# Patient Record
Sex: Female | Born: 1940 | Race: Black or African American | Hispanic: No | Marital: Single | Smoking: Current every day smoker
Health system: Southern US, Community
[De-identification: ages and names within clinical notes are randomized; demographics above are authoritative.]

## PROBLEM LIST (undated history)

## (undated) DIAGNOSIS — J449 Chronic obstructive pulmonary disease, unspecified: Secondary | ICD-10-CM

## (undated) DIAGNOSIS — M109 Gout, unspecified: Secondary | ICD-10-CM

## (undated) DIAGNOSIS — R609 Edema, unspecified: Secondary | ICD-10-CM

## (undated) DIAGNOSIS — I10 Essential (primary) hypertension: Secondary | ICD-10-CM

## (undated) DIAGNOSIS — M810 Age-related osteoporosis without current pathological fracture: Secondary | ICD-10-CM

## (undated) DIAGNOSIS — I639 Cerebral infarction, unspecified: Secondary | ICD-10-CM

## (undated) DIAGNOSIS — I509 Heart failure, unspecified: Secondary | ICD-10-CM

## (undated) DIAGNOSIS — E785 Hyperlipidemia, unspecified: Secondary | ICD-10-CM

## (undated) DIAGNOSIS — E039 Hypothyroidism, unspecified: Secondary | ICD-10-CM

## (undated) HISTORY — PX: ABDOMINAL HYSTERECTOMY: SHX81

---

## 2003-12-10 ENCOUNTER — Ambulatory Visit (HOSPITAL_COMMUNITY): Admission: RE | Admit: 2003-12-10 | Discharge: 2003-12-10 | Payer: Self-pay | Admitting: Internal Medicine

## 2012-05-30 ENCOUNTER — Non-Acute Institutional Stay (SKILLED_NURSING_FACILITY): Payer: Medicare Other | Admitting: Internal Medicine

## 2012-05-30 DIAGNOSIS — I10 Essential (primary) hypertension: Secondary | ICD-10-CM

## 2012-05-30 DIAGNOSIS — E7800 Pure hypercholesterolemia, unspecified: Secondary | ICD-10-CM

## 2012-05-30 DIAGNOSIS — E039 Hypothyroidism, unspecified: Secondary | ICD-10-CM

## 2012-05-30 DIAGNOSIS — I509 Heart failure, unspecified: Secondary | ICD-10-CM

## 2012-06-03 NOTE — Progress Notes (Signed)
PROGRESS NOTE  DATE: 05/30/12  FACILITY: [facility]  LEVEL OF CARE: SNF  Routine Visit  CHIEF COMPLAINT:  Manage hypothyroidism and CHF  HISTORY OF PRESENT ILLNESS:  REASSESSMENT OF ONGOING PROBLEM(S):  1. CHF:The patient does not relate significant weight changes, denies sob, DOE, orthopnea, PNDs, pedal edema, palpitations or chest pain.  CHF remains stable.  No complications form the medications being used.  2. HYPOTHYROIDISM: The hypothyroidism remains stable. No complications noted from the medications presently being used.  The patient denies fatigue or constipation.  Last TSH 2.635 in 9/13.  PAST MEDICAL HISTORY : Reviewed.  No changes.  CURRENT MEDICATIONS: Reviewed per [facility]  REVIEW OF SYSTEMS:  GENERAL: no change in appetite, no fatigue, no weight changes, no fever, chills or weakness RESPIRATORY: no cough, SOB, DOE, wheezing, hemoptysis CARDIAC: no chest pain, edema or palpitations GI: no abdominal pain, diarrhea, constipation, heart burn, nausea or vomiting  PHYSICAL EXAMINATION  VS:  T 97       P 70      RR 20      BP 140/60     POX % 97     WT (Lb)  GENERAL: no acute distress, normal body habitus EYES: conjunctivae normal, sclerae normal, normal eye lids RESPIRATORY: breathing is even & unlabored, BS CTAB CARDIAC: RRR, no murmur,no extra heart sounds, no edema GI: abdomen soft, normal BS, no masses, no tenderness, no hepatomegaly, no splenomegaly PSYCHIATRIC: the patient is alert & oriented to person, affect & behavior appropriate  LABS/RADIOLOGY:  9/13 CBC and CMP normal 8/13 fasting lipid panel normal  ASSESSMENT/PLAN:  1. CHF-well compensated. 2. hypothyroidism-well controlled. Recheck TSH. 3. hyperlipidemia-recheck fasting lipid panel. 4. hypertension-blood pressure borderline. Will monitor for now. We'll review a BP log. 5. COPD-compensated. 6. check CBC and CMP.  CPT CODE: 16109

## 2012-07-19 ENCOUNTER — Non-Acute Institutional Stay (SKILLED_NURSING_FACILITY): Payer: Medicare Other | Admitting: Internal Medicine

## 2012-07-19 DIAGNOSIS — E7800 Pure hypercholesterolemia, unspecified: Secondary | ICD-10-CM

## 2012-07-19 DIAGNOSIS — I10 Essential (primary) hypertension: Secondary | ICD-10-CM

## 2012-07-19 DIAGNOSIS — E039 Hypothyroidism, unspecified: Secondary | ICD-10-CM

## 2012-07-19 DIAGNOSIS — I509 Heart failure, unspecified: Secondary | ICD-10-CM

## 2012-07-22 DIAGNOSIS — I509 Heart failure, unspecified: Secondary | ICD-10-CM | POA: Insufficient documentation

## 2012-07-22 DIAGNOSIS — E7800 Pure hypercholesterolemia, unspecified: Secondary | ICD-10-CM | POA: Insufficient documentation

## 2012-07-22 DIAGNOSIS — I10 Essential (primary) hypertension: Secondary | ICD-10-CM | POA: Insufficient documentation

## 2012-07-22 DIAGNOSIS — E039 Hypothyroidism, unspecified: Secondary | ICD-10-CM | POA: Insufficient documentation

## 2012-07-22 NOTE — Progress Notes (Signed)
PROGRESS NOTE  DATE: 07/19/12  FACILITY: [facility]  LEVEL OF CARE: SNF  Routine Visit  CHIEF COMPLAINT:  Manage hypothyroidism, hypertension and CHF  HISTORY OF PRESENT ILLNESS:  REASSESSMENT OF ONGOING PROBLEM(S):  1. CHF:The patient does not relate significant weight changes, denies sob, DOE, orthopnea, PNDs, pedal edema, palpitations or chest pain.  CHF remains stable.  No complications form the medications being used.  2. HYPOTHYROIDISM: The hypothyroidism remains stable. No complications noted from the medications presently being used.  The patient denies fatigue or constipation.  Last TSH 2.453 in 4/14,  2.635 in 9/13.  3. HTN: Pt 's HTN remains stable.  Denies CP, sob, DOE, headaches, dizziness or visual disturbances.  No complications from the medications currently being used.  Last BP : 146/71, 158/82, 165/84.  PAST MEDICAL HISTORY : Reviewed.  No changes.  CURRENT MEDICATIONS: Reviewed per [facility]  REVIEW OF SYSTEMS:  GENERAL: no change in appetite, no fatigue, no weight changes, no fever, chills or weakness RESPIRATORY: no cough, SOB, DOE, wheezing, hemoptysis CARDIAC: no chest pain, edema or palpitations GI: no abdominal pain, diarrhea, constipation, heart burn, nausea or vomiting  PHYSICAL EXAMINATION  VS:  T 98.6       P96      RR 22     BP 146/71     POX %     WT (Lb)  GENERAL: no acute distress, normal body habitus EYES: conjunctivae normal, sclerae normal, normal eye lids NECK: No thyromegaly, no JVD, no masses, trachea midline RESPIRATORY: breathing is even & unlabored, BS CTAB CARDIAC: RRR, no murmur,no extra heart sounds, no edema GI: abdomen soft, normal BS, no masses, no tenderness, no hepatomegaly, no splenomegaly PSYCHIATRIC: the patient is alert & oriented to person, affect & behavior appropriate  LABS/RADIOLOGY:  4/14 platelets 473 otherwise CBC normal, CMP normal, fasting lipid panel normal  9/13 CBC and CMP normal 8/13 fasting lipid  panel normal  ASSESSMENT/PLAN:  1. CHF-well compensated. 2. hypothyroidism-well controlled. 3. hyperlipidemia-well controlled. 4. hypertension-uncontrolled. Increase lisinopril to 40 mg daily. 5. COPD-compensated. 6. check BMP on 5/24.  CPT CODE: 16109

## 2012-09-27 ENCOUNTER — Non-Acute Institutional Stay (SKILLED_NURSING_FACILITY): Payer: Medicare Other | Admitting: Adult Health

## 2012-09-27 ENCOUNTER — Encounter: Payer: Self-pay | Admitting: Adult Health

## 2012-09-27 DIAGNOSIS — E7800 Pure hypercholesterolemia, unspecified: Secondary | ICD-10-CM

## 2012-09-27 DIAGNOSIS — I509 Heart failure, unspecified: Secondary | ICD-10-CM

## 2012-09-27 DIAGNOSIS — G8194 Hemiplegia, unspecified affecting left nondominant side: Secondary | ICD-10-CM

## 2012-09-27 DIAGNOSIS — J449 Chronic obstructive pulmonary disease, unspecified: Secondary | ICD-10-CM | POA: Insufficient documentation

## 2012-09-27 DIAGNOSIS — J439 Emphysema, unspecified: Secondary | ICD-10-CM | POA: Insufficient documentation

## 2012-09-27 DIAGNOSIS — G8190 Hemiplegia, unspecified affecting unspecified side: Secondary | ICD-10-CM

## 2012-09-27 DIAGNOSIS — I10 Essential (primary) hypertension: Secondary | ICD-10-CM

## 2012-09-27 DIAGNOSIS — E039 Hypothyroidism, unspecified: Secondary | ICD-10-CM

## 2012-09-27 NOTE — Progress Notes (Signed)
Patient ID: Suzanne Rich, female   DOB: May 11, 1940, 72 y.o.   MRN: [id number]         PROGRESS NOTE  DATE: 09/27/12  FACILITY: [facility]  LEVEL OF CARE: SNF  Routine Visit  CHIEF COMPLAINT:  Manage hypothyroidism, hypertension and CHF  HISTORY OF PRESENT ILLNESS:  REASSESSMENT OF ONGOING PROBLEM(S):  1. CHF:The patient does not relate significant weight changes, denies sob, DOE, orthopnea, PNDs, pedal edema, palpitations or chest pain.  CHF remains stable.  No complications form the medications being used.  2. HTN: Denies CP, sob, DOE, headaches, dizziness or visual disturbances.  No complications from the medications currently being used.  Last BP :163/76  3. COPD: the COPD remains stable.  Pt denies sob, cough, wheezing or declining exercise tolerance.  No complications from the medications presently being used.  PAST MEDICAL HISTORY : Reviewed.  No changes.  CURRENT MEDICATIONS: Reviewed per [facility] [place]  REVIEW OF SYSTEMS:  GENERAL: no change in appetite, no fatigue, no weight changes, no fever, chills or weakness RESPIRATORY: no cough, SOB, DOE, wheezing, hemoptysis CARDIAC: no chest pain, edema or palpitations GI: no abdominal pain, diarrhea, constipation, heart burn, nausea or vomiting  PHYSICAL EXAMINATION  VS:  T 98.9       P 63       RR18    BP 163/76        WT186.8 (Lb)  GENERAL: no acute distress, normal body habitus NECK: No thyromegaly, no JVD, no masses, trachea midline RESPIRATORY: breathing is even & unlabored, BS CTAB CARDIAC: RRR, no murmur,no extra heart sounds, no edema GI: abdomen soft, normal BS, no masses, no tenderness, no hepatomegaly, no splenomegaly EXTREMITIES:  Left hemiplegia PSYCHIATRIC: the patient is alert & oriented to person, affect & behavior appropriate  LABS/RADIOLOGY: 08/17/12 knee pain profile normal 07/25/12 sodium 134 potassium 4.3 glucose 117 BUN 9 creatinine 0.82 calcium 9.6 4/14 platelets 473 otherwise CBC normal, CMP normal,  fasting lipid panel normal 9/13 CBC and CMP normal 8/13 fasting lipid panel normal  ASSESSMENT/PLAN:  1. CHF-well compensated. 2. hypothyroidism-well controlled. 3. hyperlipidemia-well controlled. 4. hypertension-uncontrolled. Increase Lopressor to 75 mg by mouth twice a day; heart rate/BP every shift x1 week 5. COPD-compensated.  CPT CODE: 78295

## 2012-10-25 LAB — HM DIABETES FOOT EXAM

## 2012-10-26 ENCOUNTER — Non-Acute Institutional Stay (SKILLED_NURSING_FACILITY): Payer: Medicare Other | Admitting: Internal Medicine

## 2012-10-26 DIAGNOSIS — E7800 Pure hypercholesterolemia, unspecified: Secondary | ICD-10-CM

## 2012-10-26 DIAGNOSIS — I10 Essential (primary) hypertension: Secondary | ICD-10-CM

## 2012-10-26 DIAGNOSIS — E039 Hypothyroidism, unspecified: Secondary | ICD-10-CM

## 2012-10-26 DIAGNOSIS — I509 Heart failure, unspecified: Secondary | ICD-10-CM

## 2012-10-26 NOTE — Progress Notes (Signed)
PROGRESS NOTE  DATE: 10/26/12  FACILITY: [facility]  LEVEL OF CARE: SNF  Routine Visit  CHIEF COMPLAINT:  Manage hypothyroidism, hypertension and CHF  HISTORY OF PRESENT ILLNESS:  REASSESSMENT OF ONGOING PROBLEM(S):  CHF:The patient does not relate significant weight changes, denies sob, DOE, orthopnea, PNDs, pedal edema, palpitations or chest pain.  CHF remains stable.  No complications form the medications being used.  HYPOTHYROIDISM: The hypothyroidism remains stable. No complications noted from the medications presently being used.  The patient denies fatigue or constipation.  Last TSH 2.453 in 4/14,  2.635 in 9/13.  HTN: Pt 's HTN remains stable.  Denies CP, sob, DOE, headaches, dizziness or visual disturbances.  No complications from the medications currently being used.  Last BP : 146/71, 158/82, 165/84, 120/73.  PAST MEDICAL HISTORY : Reviewed.  No changes.  CURRENT MEDICATIONS: Reviewed per [facility]  REVIEW OF SYSTEMS:  GENERAL: no change in appetite, no fatigue, no weight changes, no fever, chills or weakness RESPIRATORY: no cough, SOB, DOE, wheezing, hemoptysis CARDIAC: no chest pain, edema or palpitations GI: no abdominal pain, diarrhea, constipation, heart burn, nausea or vomiting  PHYSICAL EXAMINATION  VS:  T 97.6       P 77      RR 16     BP 120/73     POX %     WT (Lb)  GENERAL: no acute distress, normal body habitus NECK: No thyromegaly, no JVD, no masses, trachea midline RESPIRATORY: breathing is even & unlabored, BS CTAB CARDIAC: RRR, no murmur,no extra heart sounds, no edema GI: abdomen soft, normal BS, no masses, no tenderness, no hepatomegaly, no splenomegaly PSYCHIATRIC: the patient is alert & oriented to person, affect & behavior appropriate  LABS/RADIOLOGY:  6-14 fasting lipid panel normal 5-14 glucose 117 otherwise BMP normal  4/14 platelets 473 otherwise CBC normal, CMP normal, fasting lipid panel normal  9/13 CBC and CMP normal 8/13  fasting lipid panel normal  ASSESSMENT/PLAN:  CHF-well compensated. hypothyroidism-well controlled. hyperlipidemia-well controlled. hypertension-Lopressor was increased COPD-compensated.  CPT CODE: 16109

## 2012-11-22 ENCOUNTER — Non-Acute Institutional Stay (SKILLED_NURSING_FACILITY): Payer: Medicare Other | Admitting: Internal Medicine

## 2012-11-22 DIAGNOSIS — E039 Hypothyroidism, unspecified: Secondary | ICD-10-CM

## 2012-11-22 DIAGNOSIS — E7800 Pure hypercholesterolemia, unspecified: Secondary | ICD-10-CM

## 2012-11-22 DIAGNOSIS — I509 Heart failure, unspecified: Secondary | ICD-10-CM

## 2012-11-22 DIAGNOSIS — I10 Essential (primary) hypertension: Secondary | ICD-10-CM

## 2012-11-22 NOTE — Progress Notes (Signed)
PROGRESS NOTE  DATE: 11/22/12  FACILITY: [facility]  LEVEL OF CARE: SNF  Routine Visit  CHIEF COMPLAINT:  Manage hypothyroidism, hypertension and CHF  HISTORY OF PRESENT ILLNESS:  REASSESSMENT OF ONGOING PROBLEM(S):  CHF:The patient does not relate significant weight changes, denies sob, DOE, orthopnea, PNDs, pedal edema, palpitations or chest pain.  CHF remains stable.  No complications form the medications being used.  HYPOTHYROIDISM: The hypothyroidism remains stable. No complications noted from the medications presently being used.  The patient denies fatigue or constipation.  Last TSH 2.453 in 4/14,  2.635 in 9/13.  HTN: Pt 's HTN remains stable.  Denies CP, sob, DOE, headaches, dizziness or visual disturbances.  No complications from the medications currently being used.  Last BP : 146/71, 158/82, 165/84, 120/73, 135/60.  PAST MEDICAL HISTORY : Reviewed.  No changes.  CURRENT MEDICATIONS: Reviewed per [facility]  REVIEW OF SYSTEMS:  GENERAL: no change in appetite, no fatigue, no weight changes, no fever, chills or weakness RESPIRATORY: no cough, SOB, DOE, wheezing, hemoptysis CARDIAC: no chest pain, edema or palpitations GI: no abdominal pain, diarrhea, constipation, heart burn, nausea or vomiting  PHYSICAL EXAMINATION  VS:  T 98.2       P 66      RR 20     BP 135/60     POX %     WT (Lb)  GENERAL: no acute distress, normal body habitus NECK: No thyromegaly, no JVD, no masses, trachea midline RESPIRATORY: breathing is even & unlabored, BS CTAB CARDIAC: RRR, no murmur,no extra heart sounds, left lower extremity +2 edema GI: abdomen soft, normal BS, no masses, no tenderness, no hepatomegaly, no splenomegaly PSYCHIATRIC: the patient is alert & oriented to person, affect & behavior appropriate  LABS/RADIOLOGY:  6-14 fasting lipid panel normal 5-14 glucose 117 otherwise BMP normal  4/14 platelets 473 otherwise CBC normal, CMP normal, fasting lipid panel  normal  9/13 CBC and CMP normal 8/13 fasting lipid panel normal  ASSESSMENT/PLAN:  CHF-well compensated. hypothyroidism-well controlled. hyperlipidemia-well controlled. hypertension-well controlled COPD-compensated.  CPT CODE: 56213

## 2012-12-22 ENCOUNTER — Non-Acute Institutional Stay (SKILLED_NURSING_FACILITY): Payer: PRIVATE HEALTH INSURANCE | Admitting: Internal Medicine

## 2012-12-22 DIAGNOSIS — E039 Hypothyroidism, unspecified: Secondary | ICD-10-CM

## 2012-12-22 DIAGNOSIS — E7800 Pure hypercholesterolemia, unspecified: Secondary | ICD-10-CM

## 2012-12-22 DIAGNOSIS — I509 Heart failure, unspecified: Secondary | ICD-10-CM

## 2012-12-22 DIAGNOSIS — I10 Essential (primary) hypertension: Secondary | ICD-10-CM

## 2012-12-22 NOTE — Progress Notes (Signed)
PROGRESS NOTE  DATE: 12/22/12  FACILITY: [facility]  LEVEL OF CARE: SNF  Routine Visit  CHIEF COMPLAINT:  Manage hypothyroidism, hypertension and CHF  HISTORY OF PRESENT ILLNESS:  REASSESSMENT OF ONGOING PROBLEM(S):  CHF:The patient does not relate significant weight changes, denies sob, DOE, orthopnea, PNDs, pedal edema, palpitations or chest pain.  CHF remains stable.  No complications form the medications being used.  HYPOTHYROIDISM: The hypothyroidism remains stable. No complications noted from the medications presently being used.  The patient denies fatigue or constipation.  Last TSH 2.453 in 4/14,  2.635 in 9/13.  HTN: Pt 's HTN remains stable.  Denies CP, sob, DOE, headaches, dizziness or visual disturbances.  No complications from the medications currently being used.  Last BP : 146/71, 158/82, 165/84, 120/73, 135/60, 133/80.  PAST MEDICAL HISTORY : Reviewed.  No changes.  CURRENT MEDICATIONS: Reviewed per [facility]  REVIEW OF SYSTEMS:  GENERAL: no change in appetite, no fatigue, no weight changes, no fever, chills or weakness RESPIRATORY: no cough, SOB, DOE, wheezing, hemoptysis CARDIAC: no chest pain, edema or palpitations GI: no abdominal pain, diarrhea, constipation, heart burn, nausea or vomiting  PHYSICAL EXAMINATION  VS:  T 97.4       P 90      RR 19     BP 133/80     POX % 99     WT (Lb)  GENERAL: no acute distress, normal body habitus EYES: nl sclerae, nl conjunctivae, no discharge NECK: No thyromegaly, no JVD, no masses, trachea midline LYMPHATICS: no cervical LAN, no supraclavicular LAN RESPIRATORY: breathing is even & unlabored, BS CTAB CARDIAC: RRR, no murmur,no extra heart sounds, left lower extremity +2 edema GI: abdomen soft, normal BS, no masses, no tenderness, no hepatomegaly, no splenomegaly PSYCHIATRIC: the patient is alert & oriented to person, affect & behavior appropriate  LABS/RADIOLOGY:  6-14 fasting lipid panel normal 5-14 glucose  117 otherwise BMP normal  4/14 platelets 473 otherwise CBC normal, CMP normal, fasting lipid panel normal  9/13 CBC and CMP normal 8/13 fasting lipid panel normal  ASSESSMENT/PLAN:  CHF-well compensated. hypothyroidism-well controlled.  Check TSH. hyperlipidemia-well controlled. hypertension-well controlled COPD-compensated. Check cbc & cmp  CPT CODE: 16109

## 2013-01-10 ENCOUNTER — Non-Acute Institutional Stay (SKILLED_NURSING_FACILITY): Payer: PRIVATE HEALTH INSURANCE | Admitting: Internal Medicine

## 2013-01-10 DIAGNOSIS — I509 Heart failure, unspecified: Secondary | ICD-10-CM

## 2013-01-10 DIAGNOSIS — E039 Hypothyroidism, unspecified: Secondary | ICD-10-CM

## 2013-01-10 DIAGNOSIS — E7800 Pure hypercholesterolemia, unspecified: Secondary | ICD-10-CM

## 2013-01-10 DIAGNOSIS — I10 Essential (primary) hypertension: Secondary | ICD-10-CM

## 2013-01-12 NOTE — Progress Notes (Signed)
PROGRESS NOTE  DATE: 01/10/13  FACILITY: [facility]  LEVEL OF CARE: SNF  Routine Visit  CHIEF COMPLAINT:  Manage hypothyroidism, hypertension and CHF  HISTORY OF PRESENT ILLNESS:  REASSESSMENT OF ONGOING PROBLEM(S):  CHF:The patient does not relate significant weight changes, denies sob, DOE, orthopnea, PNDs, pedal edema, palpitations or chest pain.  CHF remains stable.  No complications form the medications being used.  HYPOTHYROIDISM: The hypothyroidism remains stable. No complications noted from the medications presently being used.  The patient denies fatigue or constipation.  Last TSH 2.453 in 4/14,  2.635 in 9/13.  HTN: Pt 's HTN remains stable.  Denies CP, sob, DOE, headaches, dizziness or visual disturbances.  No complications from the medications currently being used.  Last BP : 146/71, 158/82, 165/84, 120/73, 135/60, 133/80, 138/80.  PAST MEDICAL HISTORY : Reviewed.  No changes.  CURRENT MEDICATIONS: Reviewed per [facility]  REVIEW OF SYSTEMS:  GENERAL: no change in appetite, no fatigue, no weight changes, no fever, chills or weakness RESPIRATORY: no cough, SOB, DOE, wheezing, hemoptysis CARDIAC: no chest pain, edema or palpitations GI: no abdominal pain, diarrhea, constipation, heart burn, nausea or vomiting  PHYSICAL EXAMINATION  VS:  T 98.3       P 75      RR 18     BP 138/80     POX %     WT (Lb)  GENERAL: no acute distress, normal body habitus EYES: nl sclerae, nl conjunctivae, no discharge NECK: No thyromegaly, no JVD, no masses, trachea midline LYMPHATICS: no cervical LAN, no supraclavicular LAN RESPIRATORY: breathing is even & unlabored, BS CTAB CARDIAC: RRR, no murmur,no extra heart sounds, left lower extremity +2 edema GI: abdomen soft, normal BS, no masses, no tenderness, no hepatomegaly, no splenomegaly PSYCHIATRIC: the patient is alert & oriented to person, affect & behavior appropriate  LABS/RADIOLOGY:  6-14 fasting lipid panel normal 5-14  glucose 117 otherwise BMP normal  4/14 platelets 473 otherwise CBC normal, CMP normal, fasting lipid panel normal  9/13 CBC and CMP normal 8/13 fasting lipid panel normal  ASSESSMENT/PLAN:  CHF-well compensated. hypothyroidism-well controlled.  Check TSH. hyperlipidemia-well controlled. hypertension-well controlled COPD-compensated. Check cbc & cmp  CPT CODE: 16109

## 2013-01-31 ENCOUNTER — Non-Acute Institutional Stay (SKILLED_NURSING_FACILITY): Payer: PRIVATE HEALTH INSURANCE | Admitting: Internal Medicine

## 2013-01-31 DIAGNOSIS — I509 Heart failure, unspecified: Secondary | ICD-10-CM

## 2013-01-31 DIAGNOSIS — E039 Hypothyroidism, unspecified: Secondary | ICD-10-CM

## 2013-01-31 DIAGNOSIS — I10 Essential (primary) hypertension: Secondary | ICD-10-CM

## 2013-01-31 DIAGNOSIS — E7800 Pure hypercholesterolemia, unspecified: Secondary | ICD-10-CM

## 2013-02-02 ENCOUNTER — Encounter: Payer: Self-pay | Admitting: Internal Medicine

## 2013-02-02 NOTE — Progress Notes (Signed)
PROGRESS NOTE  DATE: 01/31/13  FACILITY: [facility]  LEVEL OF CARE: SNF  Routine Visit  CHIEF COMPLAINT:  Manage hypothyroidism, hypertension and CHF  HISTORY OF PRESENT ILLNESS:  REASSESSMENT OF ONGOING PROBLEM(S):  CHF:The patient does not relate significant weight changes, denies sob, DOE, orthopnea, PNDs, pedal edema, palpitations or chest pain.  CHF remains stable.  No complications form the medications being used.  HYPOTHYROIDISM: The hypothyroidism remains stable. No complications noted from the medications presently being used.  The patient denies fatigue or constipation.  Last TSH 2.453 in 4/14,  2.635 in 9/13, in 9-14 TSH 2.197.  HTN: Pt 's HTN remains stable.  Denies CP, sob, DOE, headaches, dizziness or visual disturbances.  No complications from the medications currently being used.  Last BP : 146/71, 158/82, 165/84, 120/73, 135/60, 133/80, 138/80.  PAST MEDICAL HISTORY : Reviewed.  No changes.  CURRENT MEDICATIONS: Reviewed per [facility]  REVIEW OF SYSTEMS:  GENERAL: no change in appetite, no fatigue, no weight changes, no fever, chills or weakness RESPIRATORY: no cough, SOB, DOE, wheezing, hemoptysis CARDIAC: no chest pain, edema or palpitations GI: no abdominal pain, diarrhea, constipation, heart burn, nausea or vomiting  PHYSICAL EXAMINATION  VS:  T 98.3       P 75      RR 18     BP 138/80     POX % 98    WT (Lb)  GENERAL: no acute distress, normal body habitus EYES: nl sclerae, nl conjunctivae, no discharge NECK: No thyromegaly, no JVD, no masses, trachea midline LYMPHATICS: no cervical LAN, no supraclavicular LAN RESPIRATORY: breathing is even & unlabored, BS CTAB CARDIAC: RRR, no murmur,no extra heart sounds, left lower extremity +2 edema GI: abdomen soft, normal BS, no masses, no tenderness, no hepatomegaly, no splenomegaly PSYCHIATRIC: the patient is alert & oriented to person, affect & behavior appropriate  LABS/RADIOLOGY:  10-14 BMP normal,  CMP normal  6-14 fasting lipid panel normal 5-14 glucose 117 otherwise BMP normal  4/14 platelets 473 otherwise CBC normal, CMP normal, fasting lipid panel normal  9/13 CBC and CMP normal 8/13 fasting lipid panel normal  ASSESSMENT/PLAN:  CHF-well compensated. hypothyroidism-well controlled. hyperlipidemia-well controlled. Check fasting lipid panel hypertension-well controlled COPD-compensated.  CPT CODE: 09811

## 2013-03-07 ENCOUNTER — Non-Acute Institutional Stay (SKILLED_NURSING_FACILITY): Payer: PRIVATE HEALTH INSURANCE | Admitting: Internal Medicine

## 2013-03-07 DIAGNOSIS — I10 Essential (primary) hypertension: Secondary | ICD-10-CM

## 2013-03-07 DIAGNOSIS — E7800 Pure hypercholesterolemia, unspecified: Secondary | ICD-10-CM

## 2013-03-07 DIAGNOSIS — E039 Hypothyroidism, unspecified: Secondary | ICD-10-CM

## 2013-03-07 DIAGNOSIS — I509 Heart failure, unspecified: Secondary | ICD-10-CM

## 2013-03-07 NOTE — Progress Notes (Signed)
PROGRESS NOTE  DATE: 03-07-13  FACILITY: [facility]  LEVEL OF CARE: SNF  Routine Visit  CHIEF COMPLAINT:  Manage hypothyroidism, hypertension and CHF  HISTORY OF PRESENT ILLNESS:  REASSESSMENT OF ONGOING PROBLEM(S):  CHF:The patient does not relate significant weight changes, denies sob, DOE, orthopnea, PNDs, pedal edema, palpitations or chest pain.  CHF remains stable.  No complications form the medications being used.  HYPOTHYROIDISM: The hypothyroidism remains stable. No complications noted from the medications presently being used.  The patient denies fatigue or constipation.  Last TSH 2.453 in 4/14,  2.635 in 9/13, in 10-14 TSH 2.197.  HTN: Pt 's HTN remains stable.  Denies CP, sob, DOE, headaches, dizziness or visual disturbances.  No complications from the medications currently being used.  Last BP : 146/71, 158/82, 165/84, 120/73, 135/60, 133/80, 138/80, 122/80.  PAST MEDICAL HISTORY : Reviewed.  No changes.  [facility]  REVIEW OF SYSTEMS:  GENERAL: no change in appetite, no fatigue, no weight changes, no fever, chills or weakness RESPIRATORY: no cough, SOB, DOE, wheezing, hemoptysis CARDIAC: no chest pain, edema or palpitations GI: no abdominal pain, diarrhea, constipation, heart burn, nausea or vomiting  PHYSICAL EXAMINATION  VS:  T 98.4      P 79    RR 16     BP 122/80     POX % 97     GENERAL: no acute distress, normal body habitus EYES: nl sclerae, nl conjunctivae, no discharge NECK: No thyromegaly, no JVD, no masses, trachea midline LYMPHATICS: no cervical LAN, no supraclavicular LAN RESPIRATORY: breathing is even & unlabored, BS CTAB CARDIAC: RRR, no murmur,no extra heart sounds, left lower extremity +2 edema GI: abdomen soft, normal BS, no masses, no tenderness, no hepatomegaly, no splenomegaly PSYCHIATRIC: the patient is alert & oriented to person, affect & behavior appropriate  LABS/RADIOLOGY:  11-14 fasting lipid  panel normal  10-14 BMP normal, CMP normal, CBC normal  6-14 fasting lipid panel normal 5-14 glucose 117 otherwise BMP normal  4/14 platelets 473 otherwise CBC normal, CMP normal, fasting lipid panel normal  9/13 CBC and CMP normal 8/13 fasting lipid panel normal  ASSESSMENT/PLAN:  CHF-well compensated. hypothyroidism-well controlled. hyperlipidemia-well controlled.  hypertension-well controlled COPD-compensated.  CPT CODE: [id number]

## 2013-06-19 ENCOUNTER — Non-Acute Institutional Stay (SKILLED_NURSING_FACILITY): Payer: PRIVATE HEALTH INSURANCE | Admitting: Internal Medicine

## 2013-06-19 DIAGNOSIS — E7800 Pure hypercholesterolemia, unspecified: Secondary | ICD-10-CM

## 2013-06-19 DIAGNOSIS — I509 Heart failure, unspecified: Secondary | ICD-10-CM

## 2013-06-19 DIAGNOSIS — I10 Essential (primary) hypertension: Secondary | ICD-10-CM

## 2013-06-19 DIAGNOSIS — E039 Hypothyroidism, unspecified: Secondary | ICD-10-CM

## 2013-06-20 NOTE — Progress Notes (Signed)
         PROGRESS NOTE  DATE: 06-19-13  FACILITY: [facility]  LEVEL OF CARE: SNF  Routine Visit  CHIEF COMPLAINT:  Manage hypothyroidism, hypertension and CHF  HISTORY OF PRESENT ILLNESS:  REASSESSMENT OF ONGOING PROBLEM(S):  CHF:The patient does not relate significant weight changes, denies sob, DOE, orthopnea, PNDs, pedal edema, palpitations or chest pain.  CHF remains stable.  No complications form the medications being used.  HYPOTHYROIDISM: The hypothyroidism remains stable. No complications noted from the medications presently being used.  The patient denies fatigue or constipation.  Last TSH 2.453 in 4/14,  2.635 in 9/13, in 10-14 TSH 2.197, in 4-15 TSH 2.919.  HTN: Pt 's HTN remains stable.  Denies CP, sob, DOE, headaches, dizziness or visual disturbances.  No complications from the medications currently being used.  Last BP : 146/71, 158/82, 165/84, 120/73, 135/60, 133/80, 138/80, 122/80.  PAST MEDICAL HISTORY : Reviewed.  No changes.  [facility]  REVIEW OF SYSTEMS:  GENERAL: no change in appetite, no fatigue, no weight changes, no fever, chills or weakness RESPIRATORY: no cough, SOB, DOE, wheezing, hemoptysis CARDIAC: no chest pain, edema or palpitations GI: no abdominal pain, diarrhea, constipation, heart burn, nausea or vomiting  PHYSICAL EXAMINATION  VS:  See VS sign section    GENERAL: no acute distress, normal body habitus EYES: nl sclerae, nl conjunctivae, no discharge NECK: No thyromegaly, no JVD, no masses, trachea midline LYMPHATICS: no cervical LAN, no supraclavicular LAN RESPIRATORY: breathing is even & unlabored, BS CTAB CARDIAC: RRR, no murmur,no extra heart sounds, left lower extremity +2 edema GI: abdomen soft, normal BS, no masses, no tenderness, no hepatomegaly, no splenomegaly PSYCHIATRIC: the patient is alert & oriented to person, affect & behavior appropriate  LABS/RADIOLOGY: 4-15 fasting lipid panel normal, CBC  and CMP normal  11-14 fasting lipid panel normal  10-14 BMP normal, CMP normal, CBC normal  6-14 fasting lipid panel normal 5-14 glucose 117 otherwise BMP normal  4/14 platelets 473 otherwise CBC normal, CMP normal, fasting lipid panel normal  9/13 CBC and CMP normal 8/13 fasting lipid panel normal  ASSESSMENT/PLAN:  CHF-well compensated. hypothyroidism-well controlled. hyperlipidemia-well controlled.  hypertension-well controlled COPD-compensated.  CPT CODE: [id number]

## 2013-07-12 ENCOUNTER — Non-Acute Institutional Stay (SKILLED_NURSING_FACILITY): Payer: PRIVATE HEALTH INSURANCE | Admitting: Internal Medicine

## 2013-07-12 DIAGNOSIS — I509 Heart failure, unspecified: Secondary | ICD-10-CM

## 2013-07-12 DIAGNOSIS — I10 Essential (primary) hypertension: Secondary | ICD-10-CM

## 2013-07-12 DIAGNOSIS — E039 Hypothyroidism, unspecified: Secondary | ICD-10-CM

## 2013-07-12 DIAGNOSIS — E7800 Pure hypercholesterolemia, unspecified: Secondary | ICD-10-CM

## 2013-07-12 NOTE — Progress Notes (Signed)
         PROGRESS NOTE  DATE: 07-12-13  FACILITY: [facility]  LEVEL OF CARE: SNF  Routine Visit  CHIEF COMPLAINT:  Manage hypothyroidism, hypertension and CHF  HISTORY OF PRESENT ILLNESS:  REASSESSMENT OF ONGOING PROBLEM(S):  CHF:The patient does not relate significant weight changes, denies sob, DOE, orthopnea, PNDs, pedal edema, palpitations or chest pain.  CHF remains stable.  No complications form the medications being used.  HYPOTHYROIDISM: The hypothyroidism remains stable. No complications noted from the medications presently being used.  The patient denies fatigue or constipation.  Last TSH 2.453 in 4/14,  2.635 in 9/13, in 10-14 TSH 2.197, in 4-15 TSH 2.919.  HTN: Pt 's HTN remains stable.  Denies CP, sob, DOE, headaches, dizziness or visual disturbances.  No complications from the medications currently being used.  Last BP : 146/71, 158/82, 165/84, 120/73, 135/60, 133/80, 138/80, 122/80, 136/72.  PAST MEDICAL HISTORY : Reviewed.  No changes.  CURRENT MEDICATIONS: [facility]  REVIEW OF SYSTEMS:  GENERAL: no change in appetite, no fatigue, no weight changes, no fever, chills or weakness RESPIRATORY: no cough, SOB, DOE, wheezing, hemoptysis CARDIAC: no chest pain, edema or palpitations GI: no abdominal pain, diarrhea, constipation, heart burn, nausea or vomiting  PHYSICAL EXAMINATION  VS:  See VS sign section    GENERAL: no acute distress, normal body habitus EYES: nl sclerae, nl conjunctivae, no discharge NECK: No thyromegaly, no JVD, no masses, trachea midline LYMPHATICS: no cervical LAN, no supraclavicular LAN RESPIRATORY: breathing is even & unlabored, BS CTAB CARDIAC: RRR, no murmur,no extra heart sounds, left lower extremity +2 edema GI: abdomen soft, normal BS, no masses, no tenderness, no hepatomegaly, no splenomegaly PSYCHIATRIC: the patient is alert & oriented to person, affect & behavior appropriate  LABS/RADIOLOGY: 4-15 fasting lipid panel  normal, CBC and CMP normal  11-14 fasting lipid panel normal  10-14 BMP normal, CMP normal, CBC normal  6-14 fasting lipid panel normal 5-14 glucose 117 otherwise BMP normal  4/14 platelets 473 otherwise CBC normal, CMP normal, fasting lipid panel normal  9/13 CBC and CMP normal 8/13 fasting lipid panel normal  ASSESSMENT/PLAN:  CHF-well compensated. hypothyroidism-well controlled. hyperlipidemia-well controlled.  hypertension-well controlled COPD-compensated.  CPT CODE: [id number]  Newton PiggGayani Y. Kerry Doryasanayaka, MD [facility] [phone]

## 2013-08-02 ENCOUNTER — Non-Acute Institutional Stay (SKILLED_NURSING_FACILITY): Payer: PRIVATE HEALTH INSURANCE | Admitting: Internal Medicine

## 2013-08-02 DIAGNOSIS — I509 Heart failure, unspecified: Secondary | ICD-10-CM

## 2013-08-02 DIAGNOSIS — I10 Essential (primary) hypertension: Secondary | ICD-10-CM

## 2013-08-02 DIAGNOSIS — E039 Hypothyroidism, unspecified: Secondary | ICD-10-CM

## 2013-08-02 DIAGNOSIS — E7800 Pure hypercholesterolemia, unspecified: Secondary | ICD-10-CM

## 2013-08-03 NOTE — Progress Notes (Signed)
         PROGRESS NOTE  DATE: 08-02-13  FACILITY: [facility]  LEVEL OF CARE: SNF  Routine Visit  CHIEF COMPLAINT:  Manage hypothyroidism, hypertension and CHF  HISTORY OF PRESENT ILLNESS:  REASSESSMENT OF ONGOING PROBLEM(S):  CHF:The patient does not relate significant weight changes, denies sob, DOE, orthopnea, PNDs, pedal edema, palpitations or chest pain.  CHF remains stable.  No complications form the medications being used.  HYPOTHYROIDISM: The hypothyroidism remains stable. No complications noted from the medications presently being used.  The patient denies fatigue or constipation.  Last TSH 2.453 in 4/14,  2.635 in 9/13, in 10-14 TSH 2.197, in 4-15 TSH 2.919.  HTN: Pt 's HTN remains stable.  Denies CP, sob, DOE, headaches, dizziness or visual disturbances.  No complications from the medications currently being used.  Last BP : 146/71, 158/82, 165/84, 120/73, 135/60, 133/80, 138/80, 122/80, 136/72, 135/70.  PAST MEDICAL HISTORY : Reviewed.  No changes.  CURRENT MEDICATIONS: Reviewed per [facility]  REVIEW OF SYSTEMS:  GENERAL: no change in appetite, no fatigue, no weight changes, no fever, chills or weakness RESPIRATORY: no cough, SOB, DOE, wheezing, hemoptysis CARDIAC: no chest pain, edema or palpitations GI: no abdominal pain, diarrhea, constipation, heart burn, nausea or vomiting  PHYSICAL EXAMINATION  VS:  See VS sign section    GENERAL: no acute distress, normal body habitus EYES: nl sclerae, nl conjunctivae, no discharge NECK: No thyromegaly, no JVD, no masses, trachea midline LYMPHATICS: no cervical LAN, no supraclavicular LAN RESPIRATORY: breathing is even & unlabored, BS CTAB CARDIAC: RRR, no murmur,no extra heart sounds, left lower extremity +2 edema GI: abdomen soft, normal BS, no masses, no tenderness, no hepatomegaly, no splenomegaly PSYCHIATRIC: the patient is alert & oriented to person, affect & behavior appropriate  LABS/RADIOLOGY: 4-15 fasting lipid  panel normal, CBC and CMP normal  11-14 fasting lipid panel normal  10-14 BMP normal, CMP normal, CBC normal  6-14 fasting lipid panel normal 5-14 glucose 117 otherwise BMP normal  4/14 platelets 473 otherwise CBC normal, CMP normal, fasting lipid panel normal  9/13 CBC and CMP normal 8/13 fasting lipid panel normal  ASSESSMENT/PLAN:  CHF-well compensated. hypothyroidism-well controlled. hyperlipidemia-well controlled.  hypertension-well controlled COPD-compensated.  CPT CODE: 86767  [name]. Kerry Dory, MD [facility] [phone]

## 2013-09-13 ENCOUNTER — Non-Acute Institutional Stay (SKILLED_NURSING_FACILITY): Payer: PRIVATE HEALTH INSURANCE | Admitting: Internal Medicine

## 2013-09-13 DIAGNOSIS — E7800 Pure hypercholesterolemia, unspecified: Secondary | ICD-10-CM

## 2013-09-13 DIAGNOSIS — I509 Heart failure, unspecified: Secondary | ICD-10-CM

## 2013-09-13 DIAGNOSIS — E039 Hypothyroidism, unspecified: Secondary | ICD-10-CM

## 2013-09-13 DIAGNOSIS — I10 Essential (primary) hypertension: Secondary | ICD-10-CM

## 2013-09-14 NOTE — Progress Notes (Signed)
         PROGRESS NOTE  DATE: 09-13-13  FACILITY: [facility]  LEVEL OF CARE: SNF  Routine Visit  CHIEF COMPLAINT:  Manage hypothyroidism, hypertension and CHF  HISTORY OF PRESENT ILLNESS:  REASSESSMENT OF ONGOING PROBLEM(S):  CHF:The patient does not relate significant weight changes, denies sob, DOE, orthopnea, PNDs, pedal edema, palpitations or chest pain.  CHF remains stable.  No complications form the medications being used.  HYPOTHYROIDISM: The hypothyroidism remains stable. No complications noted from the medications presently being used.  The patient denies fatigue or constipation.  Last TSH 2.453 in 4/14,  2.635 in 9/13, in 10-14 TSH 2.197, in 4-15 TSH 2.919.  HTN: Pt 's HTN remains stable.  Denies CP, sob, DOE, headaches, dizziness or visual disturbances.  No complications from the medications currently being used.  Last BP : 146/71, 158/82, 165/84, 120/73, 135/60, 133/80, 138/80, 122/80, 136/72, 135/70, 105/61.  PAST MEDICAL HISTORY : Reviewed.  No changes.  [facility] MED[facility]S: [facility]  REVIEW OF SYSTEMS:  GENERAL: no change in appetite, no fatigue, no weight changes, no fever, chills or weakness RESPIRATORY: no cough, SOB, DOE, wheezing, hemoptysis CARDIAC: no chest pain, edema or palpitations GI: no abdominal pain, diarrhea, constipation, heart burn, nausea or vomiting  PHYSICAL EXAMINATION  VS:  See VS sign section    GENERAL: no acute distress, normal body habitus EYES: nl sclerae, nl conjunctivae, no discharge NECK: No thyromegaly, no JVD, no masses, trachea midline LYMPHATICS: no cervical LAN, no supraclavicular LAN RESPIRATORY: breathing is even & unlabored, BS CTAB CARDIAC: RRR, no murmur,no extra heart sounds, left lower extremity +2 edema GI: abdomen soft, normal BS, no masses, no tenderness, no hepatomegaly, no splenomegaly PSYCHIATRIC: the patient is alert & oriented to person, affect & behavior appropriate  LABS/RADIOLOGY: 4-15 fasting  lipid panel normal, CBC and CMP normal  11-14 fasting lipid panel normal  10-14 BMP normal, CMP normal, CBC normal  6-14 fasting lipid panel normal 5-14 glucose 117 otherwise BMP normal  4/14 platelets 473 otherwise CBC normal, CMP normal, fasting lipid panel normal  9/13 CBC and CMP normal 8/13 fasting lipid panel normal  ASSESSMENT/PLAN:  CHF-well compensated. hypothyroidism-well controlled. hyperlipidemia-well controlled.  hypertension-well controlled COPD-compensated.  CPT CODE: [id number]  Newton PiggGayani Y. Kerry Doryasanayaka, MD [facility] [phone]

## 2013-10-18 ENCOUNTER — Non-Acute Institutional Stay (SKILLED_NURSING_FACILITY): Payer: PRIVATE HEALTH INSURANCE | Admitting: Internal Medicine

## 2013-10-18 DIAGNOSIS — I10 Essential (primary) hypertension: Secondary | ICD-10-CM

## 2013-10-18 DIAGNOSIS — E039 Hypothyroidism, unspecified: Secondary | ICD-10-CM

## 2013-10-18 DIAGNOSIS — E7800 Pure hypercholesterolemia, unspecified: Secondary | ICD-10-CM

## 2013-10-18 DIAGNOSIS — I509 Heart failure, unspecified: Secondary | ICD-10-CM

## 2013-10-19 NOTE — Progress Notes (Signed)
         PROGRESS NOTE  DATE: 10-18-13  FACILITY: [facility]  LEVEL OF CARE: SNF  Routine Visit  CHIEF COMPLAINT:  Manage hypothyroidism, hypertension and CHF  HISTORY OF PRESENT ILLNESS:  REASSESSMENT OF ONGOING PROBLEM(S):  CHF:The patient does not relate significant weight changes, denies sob, DOE, orthopnea, PNDs, pedal edema, palpitations or chest pain.  CHF remains stable.  No complications form the medications being used.  HYPOTHYROIDISM: The hypothyroidism remains stable. No complications noted from the medications presently being used.  The patient denies fatigue or constipation.  Last TSH 2.453 in 4/14,  2.635 in 9/13, in 10-14 TSH 2.197, in 4-15 TSH 2.919.  HTN: Pt 's HTN remains stable.  Denies CP, sob, DOE, headaches, dizziness or visual disturbances.  No complications from the medications currently being used.  Last BP : 146/71, 158/82, 165/84, 120/73, 135/60, 133/80, 138/80, 122/80, 136/72, 135/70, 105/61, 109/67.  PAST MEDICAL HISTORY : Reviewed.  No changes.  [facility]: [facility]  REVIEW OF SYSTEMS:  GENERAL: no change in appetite, no fatigue, no weight changes, no fever, chills or weakness RESPIRATORY: no cough, SOB, DOE, wheezing, hemoptysis CARDIAC: no chest pain, edema or palpitations GI: no abdominal pain, diarrhea, constipation, heart burn, nausea or vomiting  PHYSICAL EXAMINATION  VS:  See VS sign section    GENERAL: no acute distress, normal body habitus EYES: nl sclerae, nl conjunctivae, no discharge NECK: No thyromegaly, no JVD, no masses, trachea midline LYMPHATICS: no cervical LAN, no supraclavicular LAN RESPIRATORY: breathing is even & unlabored, BS CTAB CARDIAC: RRR, no murmur,no extra heart sounds, left lower extremity +2 edema GI: abdomen soft, normal BS, no masses, no tenderness, no hepatomegaly, no splenomegaly PSYCHIATRIC: the patient is alert & oriented to person, affect & behavior appropriate  LABS/RADIOLOGY: 4-15  fasting lipid panel normal, CBC and CMP normal  11-14 fasting lipid panel normal  10-14 BMP normal, CMP normal, CBC normal  6-14 fasting lipid panel normal 5-14 glucose 117 otherwise BMP normal  4/14 platelets 473 otherwise CBC normal, CMP normal, fasting lipid panel normal  9/13 CBC and CMP normal 8/13 fasting lipid panel normal  ASSESSMENT/PLAN:  CHF-well compensated. hypothyroidism-well controlled. hyperlipidemia-well controlled.  hypertension-well controlled COPD-compensated.  CPT CODE: [id number]  Newton PiggGayani Y. Kerry Doryasanayaka, MD [facility] [phone]

## 2013-11-08 ENCOUNTER — Non-Acute Institutional Stay (SKILLED_NURSING_FACILITY): Payer: PRIVATE HEALTH INSURANCE | Admitting: Internal Medicine

## 2013-11-08 DIAGNOSIS — I509 Heart failure, unspecified: Secondary | ICD-10-CM

## 2013-11-08 DIAGNOSIS — I10 Essential (primary) hypertension: Secondary | ICD-10-CM

## 2013-11-08 DIAGNOSIS — E7800 Pure hypercholesterolemia, unspecified: Secondary | ICD-10-CM

## 2013-11-08 DIAGNOSIS — E039 Hypothyroidism, unspecified: Secondary | ICD-10-CM

## 2013-11-10 NOTE — Progress Notes (Signed)
         PROGRESS NOTE  DATE: 11-08-13  FACILITY: [facility]  LEVEL OF CARE: SNF  Routine Visit  CHIEF COMPLAINT:  Manage hypothyroidism, hypertension and CHF  HISTORY OF PRESENT ILLNESS:  REASSESSMENT OF ONGOING PROBLEM(S):  CHF:The patient does not relate significant weight changes, denies sob, DOE, orthopnea, PNDs, pedal edema, palpitations or chest pain.  CHF remains stable.  No complications form the medications being used.  HYPOTHYROIDISM: The hypothyroidism remains stable. No complications noted from the medications presently being used.  The patient denies fatigue or constipation.  Last TSH 2.453 in 4/14,  2.635 in 9/13, in 10-14 TSH 2.197, in 4-15 TSH 2.919, in 8-15 TSH 3.04  HTN: Pt 's HTN remains stable.  Denies CP, sob, DOE, headaches, dizziness or visual disturbances.  No complications from the medications currently being used.  Last BP : 146/71, 158/82, 165/84, 120/73, 135/60, 133/80, 138/80, 122/80, 136/72, 135/70, 105/61, 109/67 113/57.  PAST MEDICAL HISTORY : Reviewed.  No changes.  CURRENT MEDICATIONS: Reviewed per [facility]  REVIEW OF SYSTEMS:  GENERAL: no change in appetite, no fatigue, no weight changes, no fever, chills or weakness RESPIRATORY: no cough, SOB, DOE, wheezing, hemoptysis CARDIAC: no chest pain, edema or palpitations GI: no abdominal pain, diarrhea, constipation, heart burn, nausea or vomiting  PHYSICAL EXAMINATION  VS:  See VS sign section    GENERAL: no acute distress, normal body habitus EYES: nl sclerae, nl conjunctivae, no discharge NECK: No thyromegaly, no JVD, no masses, trachea midline LYMPHATICS: no cervical LAN, no supraclavicular LAN RESPIRATORY: breathing is even & unlabored, BS CTAB CARDIAC: RRR, no murmur,no extra heart sounds, left lower extremity +2 edema GI: abdomen soft, normal BS, no masses, no tenderness, no hepatomegaly, no splenomegaly PSYCHIATRIC: the patient is alert & oriented to person, affect & behavior  appropriate  LABS/RADIOLOGY: 8-15 fasting panel normal, CMP normal, CBC normal 4-15 fasting lipid panel normal, CBC and CMP normal  11-14 fasting lipid panel normal  10-14 BMP normal, CMP normal, CBC normal  6-14 fasting lipid panel normal 5-14 glucose 117 otherwise BMP normal  4/14 platelets 473 otherwise CBC normal, CMP normal, fasting lipid panel normal  9/13 CBC and CMP normal 8/13 fasting lipid panel normal  ASSESSMENT/PLAN:  CHF-well compensated. hypothyroidism-well controlled. hyperlipidemia-well controlled.  hypertension-well controlled COPD-compensated.  CPT CODE: 78469  [name]. Kerry Dory, MD [facility] [phone]

## 2013-12-13 ENCOUNTER — Non-Acute Institutional Stay (SKILLED_NURSING_FACILITY): Payer: PRIVATE HEALTH INSURANCE | Admitting: Internal Medicine

## 2013-12-13 DIAGNOSIS — I509 Heart failure, unspecified: Secondary | ICD-10-CM

## 2013-12-13 DIAGNOSIS — E039 Hypothyroidism, unspecified: Secondary | ICD-10-CM

## 2013-12-13 DIAGNOSIS — E7800 Pure hypercholesterolemia, unspecified: Secondary | ICD-10-CM

## 2013-12-13 DIAGNOSIS — I10 Essential (primary) hypertension: Secondary | ICD-10-CM

## 2013-12-14 NOTE — Progress Notes (Signed)
         PROGRESS NOTE  DATE: 12-13-13  FACILITY: [facility]  LEVEL OF CARE: SNF  Routine Visit  CHIEF COMPLAINT:  Manage hypothyroidism, hypertension and CHF  HISTORY OF PRESENT ILLNESS:  REASSESSMENT OF ONGOING PROBLEM(S):  CHF:The patient does not relate significant weight changes, denies sob, DOE, orthopnea, PNDs, pedal edema, palpitations or chest pain.  CHF remains stable.  No complications form the medications being used.  HYPOTHYROIDISM: The hypothyroidism remains stable. No complications noted from the medications presently being used.  The patient denies fatigue or constipation.  Last TSH 2.453 in 4/14,  2.635 in 9/13, in 10-14 TSH 2.197, in 4-15 TSH 2.919, in 8-15 TSH 3.04  HTN: Pt 's HTN remains stable.  Denies CP, sob, DOE, headaches, dizziness or visual disturbances.  No complications from the medications currently being used.  Last BP : 146/71, 158/82, 165/84, 120/73, 135/60, 133/80, 138/80, 122/80, 136/72, 135/70, 105/61, 109/67 113/57, 119/31.  PAST MEDICAL HISTORY : Reviewed.  No changes.  CURRENT MEDICATIONS: [facility]  REVIEW OF SYSTEMS:  GENERAL: no change in appetite, no fatigue, no weight changes, no fever, chills or weakness RESPIRATORY: no cough, SOB, DOE, wheezing, hemoptysis CARDIAC: no chest pain, edema or palpitations GI: no abdominal pain, diarrhea, constipation, heart burn, nausea or vomiting  PHYSICAL EXAMINATION  VS:  See VS sign section    GENERAL: no acute distress, normal body habitus EYES: nl sclerae, nl conjunctivae, no discharge NECK: No thyromegaly, no JVD, no masses, trachea midline LYMPHATICS: no cervical LAN, no supraclavicular LAN RESPIRATORY: breathing is even & unlabored, BS CTAB CARDIAC: RRR, no murmur,no extra heart sounds, left lower extremity +2 edema GI: abdomen soft, normal BS, no masses, no tenderness, no hepatomegaly, no splenomegaly PSYCHIATRIC: the patient is alert & oriented to person, affect & behavior  appropriate  LABS/RADIOLOGY: 8-15 fasting panel normal, CMP normal, CBC normal 4-15 fasting lipid panel normal, CBC and CMP normal  11-14 fasting lipid panel normal  10-14 BMP normal, CMP normal, CBC normal  6-14 fasting lipid panel normal 5-14 glucose 117 otherwise BMP normal  4/14 platelets 473 otherwise CBC normal, CMP normal, fasting lipid panel normal  9/13 CBC and CMP normal 8/13 fasting lipid panel normal  ASSESSMENT/PLAN:  CHF-well compensated. hypothyroidism-well controlled. hyperlipidemia-well controlled.  hypertension-well controlled COPD-compensated.  CPT CODE: [id number]  Newton PiggGayani Y. Kerry Doryasanayaka, MD [facility] [phone]

## 2013-12-18 LAB — HM DIABETES EYE EXAM

## 2014-01-21 ENCOUNTER — Encounter: Payer: Self-pay | Admitting: Internal Medicine

## 2014-01-21 ENCOUNTER — Non-Acute Institutional Stay (SKILLED_NURSING_FACILITY): Payer: PRIVATE HEALTH INSURANCE | Admitting: Internal Medicine

## 2014-01-21 DIAGNOSIS — E7800 Pure hypercholesterolemia, unspecified: Secondary | ICD-10-CM

## 2014-01-21 DIAGNOSIS — G8190 Hemiplegia, unspecified affecting unspecified side: Secondary | ICD-10-CM

## 2014-01-21 DIAGNOSIS — E039 Hypothyroidism, unspecified: Secondary | ICD-10-CM

## 2014-01-21 DIAGNOSIS — I10 Essential (primary) hypertension: Secondary | ICD-10-CM

## 2014-01-21 DIAGNOSIS — I509 Heart failure, unspecified: Secondary | ICD-10-CM

## 2014-01-21 DIAGNOSIS — G8194 Hemiplegia, unspecified affecting left nondominant side: Secondary | ICD-10-CM

## 2014-01-21 NOTE — Progress Notes (Signed)
Patient ID: Suzanne Rich, female   DOB: September 15, 1940, 73 y.o.   MRN: [id number]    [facility]  Chief Complaint  Patient presents with  . Medical Management of Chronic Issues   No Known Allergies  HPI 73 y/o female pt is seen in herr room today. She is at her baseline and denies any concerns. She has pmh of chf, HTN, COPD, hypothyroidism and HLD. No new concern from staff. Her lopressor was recently discontinue and she was started on toprol xl for better bp control. Reviewed np notes from 01/14/14. bp stable on review today. She has left sided weakness post cva. She continues to smoke.   Review of Systems  Constitutional: Negative for fever, chills, weight loss, diaphoresis.  HENT: Negative for congestion, hearing loss and sore throat.   Eyes: Negative for blurred vision, double vision and discharge.  Respiratory: Negative for cough, sputum production, shortness of breath and wheezing.   Cardiovascular: Negative for chest pain, palpitations, orthopnea and leg swelling.  Gastrointestinal: Negative for heartburn, nausea, vomiting, abdominal pain.  Genitourinary: Negative for dysuria Musculoskeletal: Negative for back pain, falls  Skin: Negative for itching and rash.  Neurological: Negative for dizziness, tingling, headaches.  Psychiatric/Behavioral: Negative for depression   PMH- reviewed. See hpi    Medication List       This list is accurate as of: 01/21/14  6:21 PM.  Always use your most recent med list.               ADVAIR DISKUS 100-50 MCG/DOSE Aepb  Generic drug:  Fluticasone-Salmeterol  Inhale 1 puff into the lungs 2 (two) times daily.     aspirin 81 MG tablet  Take 81 mg by mouth daily.     furosemide 20 MG tablet  Commonly known as:  LASIX  Take 20 mg by mouth every other day. For swelling     levothyroxine 75 MCG tablet  Commonly known as:  SYNTHROID, LEVOTHROID  Take 75 mcg by mouth daily before breakfast. For  Hypothyroidism     lisinopril 40 MG tablet  Commonly known as:  PRINIVIL,ZESTRIL  Take 40 mg by mouth daily. For HTN     metoprolol succinate 100 MG 24 hr tablet  Commonly known as:  TOPROL-XL  Take 150 mg by mouth daily. Take with or immediately following a meal.     OYST-CAL 500 MG Tabs  Generic drug:  Oyster Shell Calcium  Take by mouth 3 (three) times daily. For Osteoporosis     simvastatin 10 MG tablet  Commonly known as:  ZOCOR  Take 10 mg by mouth daily. For Hyperlipidemia     spironolactone 25 MG tablet  Commonly known as:  ALDACTONE  Take 25 mg by mouth. 1/2 (12.5 mg) by mouth daily     Vitamin D3 1000 UNITS Caps  Take by mouth. 2 by mouth daily       Physical exam BP 139/67 mmHg  Pulse 68  Temp(Src) 98.3 F (36.8 C)  Resp 18  SpO2 96%  General- elderly female in no acute distress Head- atraumatic, normocephalic Eyes- PERRLA, EOMI, no pallor, no icterus, no discharge Neck- no lymphadenopathy, no thyromegaly, no jugular vein distension Mouth- normal mucus membrane Cardiovascular- normal s1,s2, no murmurs, normal distal pulses Respiratory- bilateral clear to auscultation, no wheeze, no rhonchi, no crackles Abdomen- bowel sounds present, soft, non tender Musculoskeletal- able to move right UE and LE, able to move her LLE (limited ROM). Unable to move LUE.  Trace left leg edema Neurological- no focal deficit Skin- warm and dry Psychiatry- alert and oriented to person, place and time, normal mood and affect  Labs 12/13/13 t.chol 136, ldl 70, hdl 52, tg 68 10/29/13 tsh 1.613.04, na 133. k 3.9, bun 9, cr 0.9, lft wnl, wbc 8.9, hb 13.6, hct 41.7, plt 334  Assessment/plan  HTN bp stable. Continue toprol xl 150 mg daily with lisinopril  CHF Euvolemic. Continue b blocker, aldactone, lasix and ACEI. Monitor weight.  CVA With left sided weakness, continue statin, aspirin and bp meds. bp at goal.  HLD continue zocor 10 mg daily, ldl at  goal  Hypothyroidism Stable, pending tsh, continue levothyroxine current regimen

## 2014-03-27 ENCOUNTER — Non-Acute Institutional Stay (SKILLED_NURSING_FACILITY): Payer: Medicare Other | Admitting: Internal Medicine

## 2014-03-27 DIAGNOSIS — I509 Heart failure, unspecified: Secondary | ICD-10-CM

## 2014-03-27 DIAGNOSIS — E785 Hyperlipidemia, unspecified: Secondary | ICD-10-CM | POA: Diagnosis not present

## 2014-03-27 DIAGNOSIS — E039 Hypothyroidism, unspecified: Secondary | ICD-10-CM | POA: Diagnosis not present

## 2014-03-27 NOTE — Progress Notes (Signed)
Patient ID: Suzanne Rich, female   DOB: [birth year], 74 y.o.   MRN: [id number]    [facility]  Chief complaint: medical management of chronic issues  allergies- nkda  Code status: DNR  HPI 74 y/o female pt is seen in her room today. She denies any concerns. No new concern from staff.   She has pmh of chf, HTN, COPD, hypothyroidism and HLD.   She has left sided weakness post cva. She continues to smoke.   Review of Systems   Constitutional: Negative for fever, chills, weight loss, diaphoresis.   HENT: Negative for congestion, hearing loss and sore throat.    Eyes: Negative for blurred vision, double vision and discharge.   Respiratory: Negative for cough, sputum production, shortness of breath and wheezing.    Cardiovascular: Negative for chest pain, palpitations. Gastrointestinal: Negative for heartburn, nausea, vomiting, abdominal pain, dysuria Musculoskeletal: Negative for back pain, falls     Psychiatric/Behavioral: Negative for depression    PMH- reviewed.   [facility]. [facility]  Physical exam BP 140/70 mmHg  Pulse 72  Temp(Src) 98 F (36.7 C)  Resp 18  SpO2 98%  General- elderly female in no acute distress Head- atraumatic, normocephalic Eyes- PERRLA, EOMI, no pallor, no icterus, no discharge Neck- no lymphadenopathy Mouth- normal mucus membrane, adentulous Cardiovascular- normal s1,s2, no murmurs, weak distal pulses, leg edema + left > right Respiratory- bilateral clear to auscultation, no wheeze, no rhonchi, no crackles Abdomen- bowel sounds present, soft, non tender Musculoskeletal- able to move right UE and LE, able to move her LLE (limited ROM). Unable to move LUE. Left sided weakness present.  Skin- warm and dry Psychiatry- alert and oriented, normal mood and affect  Labs 12/13/13 t.chol 136, ldl 70, hdl 52, tg 68 10/29/13 tsh 4.093.04, na 133. k 3.9, bun 9, cr 0.9, lft wnl, wbc 8.9, hb 13.6, hct 41.7, plt  334  Assessment/plan  CHF Stable, continue Aldactone 25 mg daily, toprol xl 150 mg daily and lasix 20 mg every other day with lisinopril 40 mg daily. Monitor weight. Check bmp. Ted hose to both legs  Hypothyroidism Continue synthroid 75 mcg daily, tsh 01/15/14 2.55, recheck tsh  Hyperlipidemia Continue zocor, lipid panel from 10/15 reviewed   Labs- Cbc, bmp, tsh

## 2014-03-28 LAB — CBC AND DIFFERENTIAL: WBC: 9.7 10^3/mL

## 2014-03-28 LAB — BASIC METABOLIC PANEL
BUN: 10 mg/dL (ref 4–21)
Creatinine: 0.8 mg/dL (ref <=1.1)

## 2014-04-26 ENCOUNTER — Non-Acute Institutional Stay (SKILLED_NURSING_FACILITY): Payer: Medicare Other | Admitting: Internal Medicine

## 2014-04-26 DIAGNOSIS — R600 Localized edema: Secondary | ICD-10-CM | POA: Insufficient documentation

## 2014-04-26 DIAGNOSIS — G8190 Hemiplegia, unspecified affecting unspecified side: Secondary | ICD-10-CM

## 2014-04-26 DIAGNOSIS — M949 Disorder of cartilage, unspecified: Secondary | ICD-10-CM | POA: Insufficient documentation

## 2014-04-26 DIAGNOSIS — J439 Emphysema, unspecified: Secondary | ICD-10-CM

## 2014-04-26 DIAGNOSIS — J811 Chronic pulmonary edema: Secondary | ICD-10-CM | POA: Insufficient documentation

## 2014-04-26 DIAGNOSIS — G8194 Hemiplegia, unspecified affecting left nondominant side: Secondary | ICD-10-CM

## 2014-04-26 DIAGNOSIS — E039 Hypothyroidism, unspecified: Secondary | ICD-10-CM | POA: Diagnosis not present

## 2014-04-26 NOTE — Progress Notes (Signed)
Patient ID: Suzanne Rich, female   DOB: [birth year], 74 y.o.   MRN: [id number]    [facility]  Chief Complaint  Patient presents with  . Medical Management of Chronic Issues   No Known Allergies  HPI 74 y/o female pt is seen in herr room today. She is at her baseline and denies any concerns. No new concern from staff.  She has pmh of chf, HTN, COPD, hypothyroidism and HLD.  She has left sided weakness post cva. She continues to smoke.   Review of Systems  Constitutional: Negative for fever, chills, weight loss, diaphoresis.  HENT: Negative for congestion, hearing loss and sore throat.   Eyes: Negative for blurred vision, double vision and discharge.  Respiratory: Negative for cough, sputum production, shortness of breath and wheezing.   Cardiovascular: Negative for chest pain, palpitations, orthopnea and leg swelling.  Gastrointestinal: Negative for heartburn, nausea, vomiting, abdominal pain.  Genitourinary: Negative for dysuria Musculoskeletal: Negative for back pain, falls  Skin: Negative for itching and rash.  Neurological: Negative for dizziness, tingling, headaches.  Psychiatric/Behavioral: Negative for depression    PMH- reviewed.   [facility]  Physical exam BP 150/67 mmHg  Pulse 61  Temp(Src) 98.1 F (36.7 C)  Resp 18  SpO2 98%  General- elderly female in no acute distress Head- atraumatic, normocephalic Eyes- PERRLA, EOMI, no pallor, no icterus, no discharge Neck- no lymphadenopathy Mouth- normal mucus membrane Cardiovascular- normal s1,s2, no murmurs, normal distal pulses Respiratory- bilateral clear to auscultation, no wheeze, no rhonchi, no crackles Abdomen- bowel sounds present, soft, non tender Musculoskeletal- able to move right UE and LE, able to move her LLE (limited ROM). Unable to move LUE. Left sided weakness present. Trace leg edema. Ted hose in place Neurological- no focal deficit Skin- warm  and dry Psychiatry- alert and oriented to person, place and time, normal mood and affect  Labs 03/28/14 wbc 9.7, hb 13.2, hct 39.1, plt 333, na 132, k 4.5, bun 10, cr 0.8, ca 9.5, tsh 2.82 12/13/13 t.chol 136, ldl 70, hdl 52, tg 68 10/29/13 tsh 4.093.04, na 133. 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 bp at goal.  Cartilage disorder Continue oscal and vitamin d, fall precautions  Copd Breathing stable, continue advair

## 2014-05-16 ENCOUNTER — Non-Acute Institutional Stay (SKILLED_NURSING_FACILITY): Payer: Medicare Other | Admitting: Internal Medicine

## 2014-05-16 DIAGNOSIS — E261 Secondary hyperaldosteronism: Secondary | ICD-10-CM | POA: Diagnosis not present

## 2014-05-16 DIAGNOSIS — G8190 Hemiplegia, unspecified affecting unspecified side: Secondary | ICD-10-CM | POA: Diagnosis not present

## 2014-05-16 DIAGNOSIS — I509 Heart failure, unspecified: Secondary | ICD-10-CM | POA: Diagnosis not present

## 2014-05-16 DIAGNOSIS — J439 Emphysema, unspecified: Secondary | ICD-10-CM

## 2014-05-16 DIAGNOSIS — F17200 Nicotine dependence, unspecified, uncomplicated: Secondary | ICD-10-CM

## 2014-05-16 DIAGNOSIS — N189 Chronic kidney disease, unspecified: Secondary | ICD-10-CM | POA: Insufficient documentation

## 2014-05-16 DIAGNOSIS — N182 Chronic kidney disease, stage 2 (mild): Secondary | ICD-10-CM | POA: Diagnosis not present

## 2014-05-16 DIAGNOSIS — M810 Age-related osteoporosis without current pathological fracture: Secondary | ICD-10-CM | POA: Diagnosis not present

## 2014-05-16 DIAGNOSIS — E039 Hypothyroidism, unspecified: Secondary | ICD-10-CM | POA: Diagnosis not present

## 2014-05-16 DIAGNOSIS — Z720 Tobacco use: Secondary | ICD-10-CM

## 2014-05-16 DIAGNOSIS — G8194 Hemiplegia, unspecified affecting left nondominant side: Secondary | ICD-10-CM

## 2014-05-16 NOTE — Progress Notes (Signed)
Patient ID: Suzanne Rich, female   DOB: 05/08/1940, 74 y.o.   MRN: [id number]    [facility]  Chief complaint: medical management of chronic issues  allergies- nkda  Code status: DNR  HPI 74 y/o female pt is seen in her room today. She is at her baseline and denies any concerns. No new concern from staff.   She has pmh of chf, HTN, COPD, hypothyroidism and HLD.   She has left sided weakness post cva. She continues to smoke.   Review of Systems   Constitutional: Negative for fever, chills, weight loss, diaphoresis.   HENT: Negative for congestion, hearing loss and sore throat.    Eyes: Negative for blurred vision, double vision and discharge.   Respiratory: Negative for cough, sputum production, shortness of breath and wheezing.    Cardiovascular: Negative for chest pain, palpitations. Gastrointestinal: Negative for heartburn, nausea, vomiting, abdominal pain.   Genitourinary: Negative for dysuria Musculoskeletal: Negative for back pain, falls   Skin: Negative for itching and rash.   Neurological: Negative for dizziness, tingling, headaches.   Psychiatric/Behavioral: Negative for depression    PMH- reviewed.   [facility]. [facility]  Physical exam BP 120/76 mmHg  Pulse 62  Temp(Src) 98.4 F (36.9 C)  Resp 18  SpO2 95%  General- elderly female in no acute distress Head- atraumatic, normocephalic Eyes- PERRLA, EOMI, no pallor, no icterus, no discharge Neck- no lymphadenopathy Mouth- normal mucus membrane, adentulous Cardiovascular- normal s1,s2, no murmurs, weak distal pulses Respiratory- bilateral clear to auscultation, no wheeze, no rhonchi, no crackles Abdomen- bowel sounds present, soft, non tender Musculoskeletal- able to move right UE and LE, able to move her LLE (limited ROM). Unable to move LUE. Left sided weakness present. Trace leg edema. Ted hose in place Neurological- no focal deficit Skin- warm and  dry Psychiatry- alert and oriented, normal mood and affect  Labs 03/28/14 wbc 9.7, hb 13.2, hct 39.1, plt 333, na 132, k 4.5, bun 10, cr 0.8, ca 9.5, tsh 2.82 12/13/13 t.chol 136, ldl 70, hdl 52, tg 68 10/29/13 tsh 4.093.04, na 133. 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 bp at goal. Wears left hand brace  Secondary hyperaldosteronism Chronic, stable, continue aldactone  Osteoporosis On calcium and vitamin d supplement. Monitor for falls. Smoking and limited mobility both increases her risk  ckd stage 2 Monitor renal function, continue bp medications  Heart failure Stable. Continue b blocker, ACEI, lasix and aldactone, monitor bmp periodically, no signs of volume overload

## 2014-06-12 ENCOUNTER — Non-Acute Institutional Stay (SKILLED_NURSING_FACILITY): Payer: Medicare Other | Admitting: Internal Medicine

## 2014-06-12 ENCOUNTER — Encounter: Payer: Self-pay | Admitting: Internal Medicine

## 2014-06-12 DIAGNOSIS — E039 Hypothyroidism, unspecified: Secondary | ICD-10-CM | POA: Diagnosis not present

## 2014-06-12 DIAGNOSIS — I69359 Hemiplegia and hemiparesis following cerebral infarction affecting unspecified side: Secondary | ICD-10-CM

## 2014-06-12 DIAGNOSIS — J439 Emphysema, unspecified: Secondary | ICD-10-CM | POA: Diagnosis not present

## 2014-06-12 DIAGNOSIS — E559 Vitamin D deficiency, unspecified: Secondary | ICD-10-CM | POA: Diagnosis not present

## 2014-06-12 DIAGNOSIS — E785 Hyperlipidemia, unspecified: Secondary | ICD-10-CM | POA: Diagnosis not present

## 2014-06-12 DIAGNOSIS — E782 Mixed hyperlipidemia: Secondary | ICD-10-CM | POA: Insufficient documentation

## 2014-06-12 NOTE — Progress Notes (Signed)
Patient ID: Suzanne Rich, female   DOB: [birth year], 74 y.o.   MRN: [id number]    [facility]  Chief complaint: medical management of chronic issues  allergies- nkda  Code status: DNR  HPI 74 y/o female pt is seen in her room today. She is at her baseline and denies any concerns. No new concern from staff.   She has pmh of chf, HTN, COPD, hypothyroidism and HLD.   She has left sided weakness post cva. She continues to smoke.   Review of Systems   Constitutional: Negative for fever, chills, weight loss, diaphoresis.   HENT: Negative for congestion, hearing loss and sore throat.    Eyes: Negative for blurred vision, double vision and discharge.   Respiratory: Negative for cough, sputum production, shortness of breath and wheezing.    Cardiovascular: Negative for chest pain, palpitations. Gastrointestinal: Negative for heartburn, nausea, vomiting, abdominal pain.   Genitourinary: Negative for dysuria Musculoskeletal: Negative for back pain, falls   Skin: Negative for itching and rash.   Neurological: Negative for dizziness, tingling, headaches.   Psychiatric/Behavioral: Negative for depression    PMH- reviewed.   Medication reviewed. See [facility]   Medication List       This list is accurate as of: 06/12/14  3:57 PM.  Always use your most recent med list.               ADVAIR DISKUS 100-50 MCG/DOSE Aepb  Generic drug:  Fluticasone-Salmeterol  Inhale 1 puff into the lungs 2 (two) times daily.     aspirin 81 MG tablet  Take 81 mg by mouth daily.     furosemide 20 MG tablet  Commonly known as:  LASIX  Take 20 mg by mouth every other day. For swelling     levothyroxine 75 MCG tablet  Commonly known as:  SYNTHROID, LEVOTHROID  Take 75 mcg by mouth daily before breakfast. For Hypothyroidism     lisinopril 40 MG tablet  Commonly known as:  PRINIVIL,ZESTRIL  Take 40 mg by mouth daily. For HTN     metoprolol succinate 100 MG 24 hr  tablet  Commonly known as:  TOPROL-XL  Take 150 mg by mouth daily. Take with or immediately following a meal.     OYST-CAL 500 MG Tabs  Generic drug:  Oyster Shell Calcium  Take by mouth 3 (three) times daily. For Osteoporosis     simvastatin 10 MG tablet  Commonly known as:  ZOCOR  Take 10 mg by mouth daily. For Hyperlipidemia     spironolactone 25 MG tablet  Commonly known as:  ALDACTONE  Take 25 mg by mouth. 1/2 (12.5 mg) by mouth daily     Vitamin D3 1000 UNITS Caps  Take by mouth. 2 by mouth daily     cholecalciferol 1000 UNITS tablet  Commonly known as:  VITAMIN D  Take 1,000 Units by mouth daily.        Physical exam BP 139/71 mmHg  Pulse 60  Temp(Src) 98.3 F (36.8 C)  Resp 16  SpO2 96%  General- elderly female in no acute distress Head- atraumatic, normocephalic Eyes- PERRLA, EOMI, no pallor, no icterus, no discharge Neck- no lymphadenopathy Mouth- normal mucus membrane, adentulous Cardiovascular- normal s1,s2, no murmurs, weak distal pulses Respiratory- bilateral clear to auscultation, no wheeze, no rhonchi, no crackles Abdomen- bowel sounds present, soft, non tender Musculoskeletal- able to move right UE and LE, able to move her LLE (limited ROM). Unable to move  LUE. Left sided weakness present. Trace leg edema. Ted hose in place Neurological- no focal deficit Skin- warm and dry Psychiatry- alert and oriented, normal mood and affect  Labs 03/28/14 wbc 9.7, hb 13.2, hct 39.1, plt 333, na 132, k 4.5, bun 10, cr 0.8, ca 9.5, tsh 2.82 12/13/13 t.chol 136, ldl 70, hdl 52, tg 68 10/29/13 tsh 1.61, na 133. k 3.9, bun 9, cr 0.9, lft wnl, wbc 8.9, hb 13.6, hct 41.7, plt 334  Assessment/plan  Old cva with Left hemiplegia continue statin, aspirin and bp meds. Fall precautions, assistance with ADLs  Hyperlipidemia Check lipid panel, continue zocor 10 mg daily  Hypothyroidism Stable tsh in jan, continue levothyroxine 75 mcg daily  Vitamin d def Continue  vitamin d supplement with os cal  Copd On adviar at present, continues to smoke which can worsen this, not willing to quit  Oneal Grout, MD  [facility] [phone] (Monday-Friday 8 am - 5 pm) [phone] (afterhours)

## 2014-06-13 LAB — LIPID PANEL
CHOLESTEROL: 128 mg/dL (ref 0–200)
HDL: 66 mg/dL (ref 35–70)
LDL CALC: 49 mg/dL
TRIGLYCERIDES: 66 mg/dL (ref 40–160)

## 2014-06-25 ENCOUNTER — Non-Acute Institutional Stay (SKILLED_NURSING_FACILITY): Payer: Medicare Other | Admitting: Internal Medicine

## 2014-06-25 DIAGNOSIS — E871 Hypo-osmolality and hyponatremia: Secondary | ICD-10-CM | POA: Diagnosis not present

## 2014-06-25 DIAGNOSIS — E039 Hypothyroidism, unspecified: Secondary | ICD-10-CM

## 2014-06-25 DIAGNOSIS — R5383 Other fatigue: Secondary | ICD-10-CM | POA: Diagnosis not present

## 2014-06-25 NOTE — Progress Notes (Signed)
Patient ID: Suzanne Rich, female   DOB: [birth year], 74 y.o.   MRN: [id number]    [facility]  Chief Complaint  Patient presents with  . Acute Visit    hyponatremia   No Known Allergies  Code status: DNR  HPI 74 y/o female pt is seen in her room today for abnormal lab and lethargy. She was noted to be lethargic 06/18/14 by the staff. The PA was notified. She had ordered labs with u/a, c/s and started keflex empirically. Her labs showed sodium of 126. Following this a recheck was ordered yesterday and labs have resulted today with na of 125. As per staff patient is more perked up and interactive today. Patient is seen in her room today. She is aao x 3, denies any complaints.  She has pmh of chf, HTN, COPD, hypothyroidism and HLD. Reviewed her tsh from 06/21/14 which is normal  Review of Systems   Constitutional: Negative for fever, chills, diaphoresis.   HENT: Negative for congestion   Eyes: Negative for blurred vision, double vision and discharge.   Respiratory: Negative for cough, sputum production, shortness of breath and wheezing.    Cardiovascular: Negative for chest pain, palpitations. Gastrointestinal: Negative for heartburn, nausea, vomiting, abdominal pain.   Genitourinary: Negative for dysuria Musculoskeletal: Negative for muscle aches/ spasm Neurological: Negative for dizziness, tingling, headaches.   Psychiatric/Behavioral: Negative for depression    PMH- reviewed.     Medication List       This list is accurate as of: 06/25/14 11:50 AM.  Always use your most recent med list.               ADVAIR DISKUS 100-50 MCG/DOSE Aepb  Generic drug:  Fluticasone-Salmeterol  Inhale 1 puff into the lungs 2 (two) times daily.     aspirin 81 MG tablet  Take 81 mg by mouth daily.     furosemide 20 MG tablet  Commonly known as:  LASIX  Take 20 mg by mouth every other day. For swelling     levothyroxine 75 MCG tablet  Commonly known  as:  SYNTHROID, LEVOTHROID  Take 75 mcg by mouth daily before breakfast. For Hypothyroidism     lisinopril 40 MG tablet  Commonly known as:  PRINIVIL,ZESTRIL  Take 40 mg by mouth daily. For HTN     metoprolol succinate 100 MG 24 hr tablet  Commonly known as:  TOPROL-XL  Take 100 mg by mouth daily. Take with or immediately following a meal.     OYST-CAL 500 MG Tabs  Generic drug:  Oyster Shell Calcium  Take by mouth 3 (three) times daily. For Osteoporosis     simvastatin 10 MG tablet  Commonly known as:  ZOCOR  Take 10 mg by mouth daily. For Hyperlipidemia     spironolactone 25 MG tablet  Commonly known as:  ALDACTONE  Take 25 mg by mouth daily.     Vitamin D3 1000 UNITS Caps  Take by mouth. 2 by mouth daily     cholecalciferol 1000 UNITS tablet  Commonly known as:  VITAMIN D  Take 1,000 Units by mouth daily.       Physical exam BP 112/74 mmHg  Pulse 84  Temp(Src) 97.8 F (36.6 C)  Resp 18  SpO2 98%  General- elderly female in no acute distress Head- atraumatic, normocephalic Eyes- PERRLA, EOMI, no pallor, no icterus, no discharge Neck- no lymphadenopathy Mouth- normal mucus membrane, adentulous Cardiovascular- normal s1,s2, no murmurs, weak distal  pulses Respiratory- bilateral clear to auscultation, no wheeze, no rhonchi, no crackles Abdomen- bowel sounds present, soft, non tender Musculoskeletal- able to move right UE and LE, able to move her LLE (limited ROM). Unable to move LUE. Left sided weakness present. Trace leg edema. Ted hose in place Neurological- no new focal deficit, no involuntary jerks noted Skin- warm and dry Psychiatry- alert and oriented, normal mood and affect  Labs 06/24/14 na 125, k 4.6, cl 90, glu 80, bun 7, cr 0.7, ca 10.1 06/21/14 tsh 2.645, na 126, k 4.4, glu 85, co2 22, bun 8, cr 0.67 03/28/14 wbc 9.7, hb 13.2, hct 39.1, plt 333, na 132, k 4.5, bun 10, cr 0.8, ca 9.5, tsh 2.82 12/13/13 t.chol 136, ldl 70, hdl 52, tg 68 10/29/13 tsh 1.61,  na 133. k 3.9, bun 9, cr 0.9, lft wnl, wbc 8.9, hb 13.6, hct 41.7, plt 334  Assessment/plan  Lethargy Patient appears perked up and is alert and oriented to person, place and time at present. Reviewed her thyroid level, white count and bmp. Her low sodium could have contributed some but at present, it appears resolved. Her increased dosing of aldactone could be contributing some too. Decrease aldactone to 12.5 mg daily.  Hyponatremia Has low sodium with low chloride level. Patient is on lasix which can contribute to this. Her leg edema is minimal. Discontinue lasix for now. Start her on salt table 1g tablet twice a day. She is alert and oriented at present. She will not need iv fluids at present. Monitor clinically for now. Check cmp 06/28/14. Monitor weight for now as we are holding lasix and have reduced her aldactone dosing  Hypothyroidism Stable tsh. Continue levothyroxine 75 mcg daily   Spent 30 minutes in patient care, reviewed care plan with patient, charge nurse and nursing supervisor  Oneal Grout, MD  [facility] [phone] (Monday-Friday 8 am - 5 pm) [phone] (afterhours)

## 2014-07-09 ENCOUNTER — Emergency Department (HOSPITAL_COMMUNITY): Payer: Medicare Other

## 2014-07-09 ENCOUNTER — Encounter (HOSPITAL_COMMUNITY): Payer: Self-pay | Admitting: *Deleted

## 2014-07-09 ENCOUNTER — Non-Acute Institutional Stay (SKILLED_NURSING_FACILITY): Payer: Medicare Other | Admitting: Internal Medicine

## 2014-07-09 ENCOUNTER — Inpatient Hospital Stay (HOSPITAL_COMMUNITY): Payer: Medicare Other

## 2014-07-09 ENCOUNTER — Inpatient Hospital Stay (HOSPITAL_COMMUNITY)
Admission: EM | Admit: 2014-07-09 | Discharge: 2014-07-12 | DRG: 300 | Disposition: A | Discharge status: Skilled Nursing Facility | Payer: Medicare Other | Attending: Internal Medicine | Admitting: Internal Medicine

## 2014-07-09 DIAGNOSIS — E785 Hyperlipidemia, unspecified: Secondary | ICD-10-CM | POA: Diagnosis present

## 2014-07-09 DIAGNOSIS — I10 Essential (primary) hypertension: Secondary | ICD-10-CM | POA: Diagnosis present

## 2014-07-09 DIAGNOSIS — Z90710 Acquired absence of both cervix and uterus: Secondary | ICD-10-CM | POA: Diagnosis not present

## 2014-07-09 DIAGNOSIS — M109 Gout, unspecified: Secondary | ICD-10-CM | POA: Diagnosis present

## 2014-07-09 DIAGNOSIS — L089 Local infection of the skin and subcutaneous tissue, unspecified: Secondary | ICD-10-CM | POA: Diagnosis not present

## 2014-07-09 DIAGNOSIS — E871 Hypo-osmolality and hyponatremia: Secondary | ICD-10-CM | POA: Diagnosis present

## 2014-07-09 DIAGNOSIS — I96 Gangrene, not elsewhere classified: Secondary | ICD-10-CM | POA: Diagnosis present

## 2014-07-09 DIAGNOSIS — Z7951 Long term (current) use of inhaled steroids: Secondary | ICD-10-CM | POA: Diagnosis not present

## 2014-07-09 DIAGNOSIS — Z7982 Long term (current) use of aspirin: Secondary | ICD-10-CM

## 2014-07-09 DIAGNOSIS — E039 Hypothyroidism, unspecified: Secondary | ICD-10-CM | POA: Diagnosis present

## 2014-07-09 DIAGNOSIS — R1900 Intra-abdominal and pelvic swelling, mass and lump, unspecified site: Secondary | ICD-10-CM | POA: Diagnosis present

## 2014-07-09 DIAGNOSIS — F17200 Nicotine dependence, unspecified, uncomplicated: Secondary | ICD-10-CM

## 2014-07-09 DIAGNOSIS — I69354 Hemiplegia and hemiparesis following cerebral infarction affecting left non-dominant side: Secondary | ICD-10-CM

## 2014-07-09 DIAGNOSIS — J439 Emphysema, unspecified: Secondary | ICD-10-CM | POA: Diagnosis present

## 2014-07-09 DIAGNOSIS — K5900 Constipation, unspecified: Secondary | ICD-10-CM | POA: Diagnosis present

## 2014-07-09 DIAGNOSIS — L03115 Cellulitis of right lower limb: Secondary | ICD-10-CM | POA: Diagnosis present

## 2014-07-09 DIAGNOSIS — I509 Heart failure, unspecified: Secondary | ICD-10-CM | POA: Diagnosis present

## 2014-07-09 DIAGNOSIS — F17210 Nicotine dependence, cigarettes, uncomplicated: Secondary | ICD-10-CM | POA: Diagnosis present

## 2014-07-09 DIAGNOSIS — F0390 Unspecified dementia without behavioral disturbance: Secondary | ICD-10-CM | POA: Diagnosis present

## 2014-07-09 DIAGNOSIS — J449 Chronic obstructive pulmonary disease, unspecified: Secondary | ICD-10-CM | POA: Diagnosis present

## 2014-07-09 DIAGNOSIS — Z7401 Bed confinement status: Secondary | ICD-10-CM | POA: Diagnosis not present

## 2014-07-09 DIAGNOSIS — Z720 Tobacco use: Secondary | ICD-10-CM | POA: Diagnosis not present

## 2014-07-09 DIAGNOSIS — I70261 Atherosclerosis of native arteries of extremities with gangrene, right leg: Secondary | ICD-10-CM | POA: Diagnosis not present

## 2014-07-09 HISTORY — DX: Chronic obstructive pulmonary disease, unspecified: J44.9

## 2014-07-09 HISTORY — DX: Hyperlipidemia, unspecified: E78.5

## 2014-07-09 HISTORY — DX: Hypothyroidism, unspecified: E03.9

## 2014-07-09 HISTORY — DX: Gout, unspecified: M10.9

## 2014-07-09 HISTORY — DX: Age-related osteoporosis without current pathological fracture: M81.0

## 2014-07-09 HISTORY — DX: Edema, unspecified: R60.9

## 2014-07-09 HISTORY — DX: Cerebral infarction, unspecified: I63.9

## 2014-07-09 HISTORY — DX: Heart failure, unspecified: I50.9

## 2014-07-09 HISTORY — DX: Essential (primary) hypertension: I10

## 2014-07-09 LAB — CBC WITH DIFFERENTIAL/PLATELET
BASOS PCT: 0 % (ref 0–1)
Basophils Absolute: 0 10*3/uL (ref 0.0–0.1)
Eosinophils Absolute: 0.1 10*3/uL (ref 0.0–0.7)
Eosinophils Relative: 1 % (ref 0–5)
HEMATOCRIT: 36.4 % (ref 36.0–46.0)
HEMOGLOBIN: 12.7 g/dL (ref 12.0–15.0)
LYMPHS PCT: 16 % (ref 12–46)
Lymphs Abs: 1.8 10*3/uL (ref 0.7–4.0)
MCH: 30.8 pg (ref 26.0–34.0)
MCHC: 34.9 g/dL (ref 30.0–36.0)
MCV: 88.1 fL (ref 78.0–100.0)
MONO ABS: 1.3 10*3/uL — AB (ref 0.1–1.0)
Monocytes Relative: 12 % (ref 3–12)
Neutro Abs: 8 10*3/uL — ABNORMAL HIGH (ref 1.7–7.7)
Neutrophils Relative %: 71 % (ref 43–77)
Platelets: 398 10*3/uL (ref 150–400)
RBC: 4.13 MIL/uL (ref 3.87–5.11)
RDW: 13.7 % (ref 11.5–15.5)
WBC: 11.2 10*3/uL — ABNORMAL HIGH (ref 4.0–10.5)

## 2014-07-09 LAB — BASIC METABOLIC PANEL
ANION GAP: 6 (ref 5–15)
BUN: 9 mg/dL (ref 6–20)
CO2: 25 mmol/L (ref 22–32)
Calcium: 9.5 mg/dL (ref 8.9–10.3)
Chloride: 92 mmol/L — ABNORMAL LOW (ref 101–111)
Creatinine, Ser: 0.76 mg/dL (ref 0.44–1.00)
GFR calc Af Amer: >60 mL/min (ref >60)
GLUCOSE: 98 mg/dL (ref 70–99)
Potassium: 4.6 mmol/L (ref 3.5–5.1)
SODIUM: 123 mmol/L — AB (ref 135–145)

## 2014-07-09 LAB — HEPATIC FUNCTION PANEL
ALT: 15 U/L (ref 7–35)
AST: 21 U/L (ref 13–35)
Alkaline Phosphatase: 83 U/L (ref 25–125)
BILIRUBIN, TOTAL: 0.4 mg/dL

## 2014-07-09 MED ORDER — SODIUM CHLORIDE 0.9 % IV SOLN
INTRAVENOUS | Status: DC
  Administered 2014-07-09: 20 mL/h via INTRAVENOUS

## 2014-07-09 MED ORDER — VANCOMYCIN HCL IN DEXTROSE 1-5 GM/200ML-% IV SOLN
1000.0000 mg | Freq: Once | INTRAVENOUS | Status: AC
  Administered 2014-07-09: 1000 mg via INTRAVENOUS
  Filled 2014-07-09: qty 200

## 2014-07-09 NOTE — ED Notes (Signed)
[facility], states been having R great toe and R ring toe pain x month, been black x 2 weeks, staff their stated they were unable to get an xray at facility so sent here for eval.

## 2014-07-09 NOTE — ED Notes (Signed)
Pts has positive pulses to R foot, doppler used.

## 2014-07-09 NOTE — ED Notes (Signed)
Called floor and relayed that [facility].

## 2014-07-09 NOTE — ED Notes (Signed)
Patient transported to X-ray 

## 2014-07-09 NOTE — ED Notes (Signed)
Pt states she is here d/t the pain in her R foot, states her toes have been like "this" for a while, pts great toe and 4th toe have black necrotic spot on bottom of toe, pt states whole R foot hurts and she can't stand the pain anymore.

## 2014-07-09 NOTE — Progress Notes (Signed)
Patient ID: Suzanne Rich, female   DOB: 23-Dec-1940, 74 y.o.   MRN: [id number]      [facility]  Chief Complaint  Patient presents with  . Acute Visit    right foot swelling and pain    allergies- nkda  Code status: DNR  HPI 74 y/o female pt is seen in her room today. Staff noticed new redness and swelling in her right foot. She complaints of pain. She noticed this about 4 days back. She was seen by Guy FrancoLynn Brown PA yesterday. She noticed new blackening of her great and little toes with some drainage from great toe. Patient is a everyday smoker. She denies throbbing pain. She had an xray done this am to rule out gas gangrene and osteomyelitis. Xray is resulted with no comment on her toes. She has been started on doxycycline this morning. She mentions feeling fair and denies any concerns. She has pmh of chf, HTN, COPD, hypothyroidism, tobacco use, old CVA with left sided weakness and HLD.    Review of Systems   Constitutional: Negative for fever, chills HENT: Negative for congestion  Eyes: Negative for blurred vision, double vision and discharge.   Respiratory: Negative for cough, sputum production, shortness of breath and wheezing.    Cardiovascular: Negative for chest pain, palpitations. Gastrointestinal: Negative for heartburn, nausea, vomiting, abdominal pain.   Genitourinary: Negative for dysuria Musculoskeletal: Negative for back pain, falls, trauma to her foot Skin: Negative for itching and rash.   Neurological: Negative for dizziness, tingling, headaches.   Psychiatric/Behavioral: Negative for depression    PMH- reviewed.   Medication reviewed. [facility]  Physical exam BP 140/73 mmHg  Pulse 65  Temp(Src) 97 F (36.1 C)  Resp 18  SpO2 99%  General- elderly female in no acute distress Head- atraumatic, normocephalic Eyes- PERRLA, EOMI, no pallor, no icterus, no discharge Neck- no lymphadenopathy Mouth- normal mucus membrane,  adentulous Cardiovascular- normal s1,s2, no murmurs Respiratory- bilateral clear to auscultation, no wheeze, no rhonchi, no crackles Abdomen- bowel sounds present, soft, non tender Musculoskeletal- able to move right UE and LE, able to move her LLE (limited ROM). Unable to move LUE. Left sided weakness present. Trace leg edema but 1+ foot edema with erythema noted. Has ted hose in place. Right great toe and little toe with blackened area. Purulent drainage from right toe, tender to touch on both foot and toes. Unable to palpate dorsalis pedis or right foot Neurological- no focal deficit Psychiatry- alert and oriented, normal mood and affect  Labs 06/24/14 na 125, k 4.6, cl 90, glu 80, bun 7, cr 0.7, ca 10.1 06/21/14 tsh 2.645, na 126, k 4.4, glu 85, co2 22, bun 8, cr 0.67 03/28/14 wbc 9.7, hb 13.2, hct 39.1, plt 333, na 132, k 4.5, bun 10, cr 0.8, ca 9.5, tsh 2.82 12/13/13 t.chol 136, ldl 70, hdl 52, tg 68 10/29/13 tsh 4.093.04, na 133. k 3.9, bun 9, cr 0.9, lft wnl, wbc 8.9, hb 13.6, hct 41.7, plt 334  Assessment/plan  1. Gangrenous toe New finding. No palpable dorsalis pedis in right foot. New discoloration of foot and toes. High risk for PAD and related embolism/ occlusion given her history of CVA, HLD, smoking and HTN. Sending to ED for further evaluation and treatment. Will need to be seen by vascular team.  2. Right foot infection Cellulitis vs osteomyelitis given the erythema, warmth, pain and drainage. Started on doxycycline for presumed cellulitis. Patient will benefit from MRI to rule  out osteomyelitis  3. Tobacco use disorder This puts her at high risk for PAD   Spent 30 minutes on evaluation and co-ordination of care  Oneal GroutMAHIMA Wen Merced, MD  [facility] [phone] (Monday-Friday 8 am - 5 pm) [phone] (afterhours)

## 2014-07-09 NOTE — ED Notes (Signed)
Bed: [id number] Expected date:  Expected time:  Means of arrival:  Comments: EMS 74 yo female with necrotic tissue on toes

## 2014-07-09 NOTE — ED Provider Notes (Signed)
CSN: [id number]     Arrival date & time 07/09/14  2010 History   First MD Initiated Contact with Patient 07/09/14 2100     Chief Complaint  Patient presents with  . necrotic tissue      (Consider location/radiation/quality/duration/timing/severity/associated sxs/prior Treatment) HPI Comments: Patient here complaining of long-standing right foot pain localized to her first and 4th toes. She has had necrotic tissue there but denies any fever or chills. Has been increasingly worse over the past 3 days. Things have been erratic tissue probably started about 2 weeks ago. Stays at a facility and was sent here for further evaluation. Denies any prior history of peripheral vascular disease. She has been treated with local wound care at this time. She has not been to the wound care center. EMS was called and patient transported here  The history is provided by the patient.    Past Medical History  Diagnosis Date  . Hypertension   . Stroke   . COPD (chronic obstructive pulmonary disease)   . Hypothyroidism   . Hyperlipidemia   . Edema   . CHF (congestive heart failure)   . Gout   . OP (osteoporosis)    History reviewed. No pertinent past surgical history. No family history on file. History  Substance Use Topics  . Smoking status: Current Every Day Smoker  . Smokeless tobacco: Not on file  . Alcohol Use: Not on file   OB History    No data available     Review of Systems  All other systems reviewed and are negative.     Allergies  Review of patient's allergies indicates no known allergies.  Home Medications   Prior to Admission medications   Medication Sig Start Date End Date Taking? Authorizing Provider  aspirin 81 MG tablet Take 81 mg by mouth daily.   Yes Historical Provider, MD  calcium-vitamin D (OSCAL WITH D) 500-200 MG-UNIT per tablet Take 1 tablet by mouth 3 (three) times daily.   Yes Historical Provider, MD  Cholecalciferol (VITAMIN D3) 1000 UNITS CAPS Take 2,000  Units by mouth daily. 2 by mouth daily   Yes Historical Provider, MD  Fluticasone-Salmeterol (ADVAIR DISKUS) 100-50 MCG/DOSE AEPB Inhale 1 puff into the lungs 2 (two) times daily.   Yes Historical Provider, MD  levothyroxine (SYNTHROID, LEVOTHROID) 75 MCG tablet Take 75 mcg by mouth daily before breakfast. For Hypothyroidism   Yes Historical Provider, MD  lisinopril (PRINIVIL,ZESTRIL) 40 MG tablet Take 40 mg by mouth daily. For HTN   Yes Historical Provider, MD  metoprolol succinate (TOPROL-XL) 100 MG 24 hr tablet Take 100 mg by mouth daily. Take with or immediately following a meal.   Yes Historical Provider, MD  simvastatin (ZOCOR) 10 MG tablet Take 10 mg by mouth daily. For Hyperlipidemia   Yes Historical Provider, MD  spironolactone (ALDACTONE) 25 MG tablet Take 25 mg by mouth daily.   Yes Historical Provider, MD   BP 180/75 mmHg  Pulse 69  Temp(Src) 98.4 F (36.9 C) (Oral)  Resp 18  SpO2 100% Physical Exam  Constitutional: She is oriented to person, place, and time. She appears well-developed and well-nourished.  Non-toxic appearance. No distress.  HENT:  Head: Normocephalic and atraumatic.  Eyes: Conjunctivae, EOM and lids are normal. Pupils are equal, round, and reactive to light.  Neck: Normal range of motion. Neck supple. No tracheal deviation present. No thyroid mass present.  Cardiovascular: Normal rate, regular rhythm and normal heart sounds.  Exam reveals no gallop.  No murmur heard. Pulmonary/Chest: Effort normal and breath sounds normal. No stridor. No respiratory distress. She has no decreased breath sounds. She has no wheezes. She has no rhonchi. She has no rales.  Abdominal: Soft. Normal appearance and bowel sounds are normal. She exhibits no distension. There is no tenderness. There is no rebound and no CVA tenderness.  Musculoskeletal: Normal range of motion. She exhibits no edema or tenderness.       Feet:  Neurological: She is alert and oriented to person, place, and  time. She has normal strength. No cranial nerve deficit or sensory deficit. GCS eye subscore is 4. GCS verbal subscore is 5. GCS motor subscore is 6.  Skin: Skin is warm and dry. No abrasion and no rash noted.  Psychiatric: She has a normal mood and affect. Her speech is normal and behavior is normal.  Nursing note and vitals reviewed.   ED Course  Procedures (including critical care time) Labs Review Labs Reviewed  CBC WITH DIFFERENTIAL/PLATELET  BASIC METABOLIC PANEL    Imaging Review No results found.   EKG Interpretation   Date/Time:  Tuesday Jul 09 2014 21:34:24 EDT Ventricular Rate:  63 PR Interval:  136 QRS Duration: 83 QT Interval:  432 QTC Calculation: 442 R Axis:   -47 Text Interpretation:  Sinus rhythm Left anterior fascicular block  Confirmed by Rue Valladares  MD, Tammera Engert ([id number]) on 07/09/2014 10:48:11 PM      MDM   Final diagnoses:  None         Patient started on vancomycin and will be admitted to the hospitalist service  Lorre NickAnthony Benjaman Artman, MD 07/09/14 2248

## 2014-07-10 ENCOUNTER — Encounter (HOSPITAL_COMMUNITY): Payer: Self-pay | Admitting: Internal Medicine

## 2014-07-10 ENCOUNTER — Inpatient Hospital Stay (HOSPITAL_COMMUNITY): Payer: Medicare Other

## 2014-07-10 DIAGNOSIS — I70261 Atherosclerosis of native arteries of extremities with gangrene, right leg: Secondary | ICD-10-CM

## 2014-07-10 DIAGNOSIS — I10 Essential (primary) hypertension: Secondary | ICD-10-CM | POA: Diagnosis present

## 2014-07-10 DIAGNOSIS — I96 Gangrene, not elsewhere classified: Secondary | ICD-10-CM | POA: Diagnosis present

## 2014-07-10 DIAGNOSIS — E871 Hypo-osmolality and hyponatremia: Secondary | ICD-10-CM | POA: Diagnosis present

## 2014-07-10 DIAGNOSIS — L03115 Cellulitis of right lower limb: Secondary | ICD-10-CM | POA: Diagnosis present

## 2014-07-10 LAB — BASIC METABOLIC PANEL
Anion gap: 11 (ref 5–15)
Anion gap: 8 (ref 5–15)
Anion gap: 8 (ref 5–15)
BUN: 6 mg/dL (ref 6–20)
BUN: 6 mg/dL (ref 6–20)
BUN: 7 mg/dL (ref 6–20)
CALCIUM: 8.9 mg/dL (ref 8.9–10.3)
CHLORIDE: 95 mmol/L — AB (ref 101–111)
CO2: 20 mmol/L — AB (ref 22–32)
CO2: 25 mmol/L (ref 22–32)
CO2: 25 mmol/L (ref 22–32)
CREATININE: 0.79 mg/dL (ref 0.44–1.00)
CREATININE: 0.73 mg/dL (ref 0.44–1.00)
Calcium: 9 mg/dL (ref 8.9–10.3)
Calcium: 9 mg/dL (ref 8.9–10.3)
Chloride: 94 mmol/L — ABNORMAL LOW (ref 101–111)
Chloride: 94 mmol/L — ABNORMAL LOW (ref 101–111)
Creatinine, Ser: 0.81 mg/dL (ref 0.44–1.00)
GFR calc Af Amer: >60 mL/min (ref >60)
GFR calc Af Amer: >60 mL/min (ref >60)
GFR calc non Af Amer: >60 mL/min (ref >60)
GFR calc non Af Amer: >60 mL/min (ref >60)
GLUCOSE: 119 mg/dL — AB (ref 70–99)
Glucose, Bld: 98 mg/dL (ref 70–99)
Glucose, Bld: 111 mg/dL — ABNORMAL HIGH (ref 70–99)
Potassium: 4.1 mmol/L (ref 3.5–5.1)
Potassium: 4 mmol/L (ref 3.5–5.1)
Potassium: 4.3 mmol/L (ref 3.5–5.1)
Sodium: 126 mmol/L — ABNORMAL LOW (ref 135–145)
Sodium: 127 mmol/L — ABNORMAL LOW (ref 135–145)
Sodium: 127 mmol/L — ABNORMAL LOW (ref 135–145)

## 2014-07-10 LAB — CBC
HCT: 33.3 % — ABNORMAL LOW (ref 36.0–46.0)
HCT: 35.8 % — ABNORMAL LOW (ref 36.0–46.0)
HEMOGLOBIN: 12.5 g/dL (ref 12.0–15.0)
Hemoglobin: 11.8 g/dL — ABNORMAL LOW (ref 12.0–15.0)
MCH: 30.6 pg (ref 26.0–34.0)
MCH: 30.2 pg (ref 26.0–34.0)
MCHC: 35.4 g/dL (ref 30.0–36.0)
MCHC: 34.9 g/dL (ref 30.0–36.0)
MCV: 86.5 fL (ref 78.0–100.0)
MCV: 86.5 fL (ref 78.0–100.0)
Platelets: 390 10*3/uL (ref 150–400)
Platelets: 401 10*3/uL — ABNORMAL HIGH (ref 150–400)
RBC: 3.85 MIL/uL — ABNORMAL LOW (ref 3.87–5.11)
RBC: 4.14 MIL/uL (ref 3.87–5.11)
RDW: 13.5 % (ref 11.5–15.5)
RDW: 13.4 % (ref 11.5–15.5)
WBC: 9.4 10*3/uL (ref 4.0–10.5)
WBC: 9.8 10*3/uL (ref 4.0–10.5)

## 2014-07-10 LAB — HEPATIC FUNCTION PANEL
ALT: 18 U/L (ref 14–54)
AST: 24 U/L (ref 15–41)
Albumin: 3.1 g/dL — ABNORMAL LOW (ref 3.5–5.0)
Alkaline Phosphatase: 77 U/L (ref 38–126)
BILIRUBIN INDIRECT: 0.6 mg/dL (ref 0.3–0.9)
Bilirubin, Direct: 0.1 mg/dL (ref 0.1–0.5)
TOTAL PROTEIN: 6.2 g/dL — AB (ref 6.5–8.1)
Total Bilirubin: 0.7 mg/dL (ref 0.3–1.2)

## 2014-07-10 LAB — OSMOLALITY: OSMOLALITY: 264 mosm/kg — AB (ref 275–300)

## 2014-07-10 LAB — CREATININE, URINE, RANDOM: CREATININE, URINE: 79.6 mg/dL

## 2014-07-10 LAB — URIC ACID: URIC ACID, SERUM: 2.4 mg/dL (ref 2.3–6.6)

## 2014-07-10 LAB — CORTISOL: CORTISOL PLASMA: 11.3 ug/dL

## 2014-07-10 LAB — OSMOLALITY, URINE: OSMOLALITY UR: 316 mosm/kg — AB (ref 390–1090)

## 2014-07-10 LAB — SODIUM, URINE, RANDOM: Sodium, Ur: 52 mEq/L

## 2014-07-10 LAB — TSH: TSH: 2.593 u[IU]/mL (ref 0.350–4.500)

## 2014-07-10 MED ORDER — ACETAMINOPHEN 325 MG PO TABS: 650.0000 mg | ORAL_TABLET | Freq: Four times a day (QID) | ORAL | Status: DC | PRN

## 2014-07-10 MED ORDER — IOHEXOL 300 MG/ML  SOLN
25.0000 mL | INTRAMUSCULAR | Status: AC
  Administered 2014-07-10 (×2): 25 mL via ORAL

## 2014-07-10 MED ORDER — ONDANSETRON HCL 4 MG PO TABS: 4.0000 mg | ORAL_TABLET | Freq: Four times a day (QID) | ORAL | Status: DC | PRN

## 2014-07-10 MED ORDER — METOPROLOL SUCCINATE ER 100 MG PO TB24
100.0000 mg | ORAL_TABLET | Freq: Every day | ORAL | Status: DC
  Administered 2014-07-10 – 2014-07-11 (×2): 100 mg via ORAL
  Filled 2014-07-10 (×3): qty 1

## 2014-07-10 MED ORDER — HEPARIN SODIUM (PORCINE) 5000 UNIT/ML IJ SOLN
5000.0000 [IU] | Freq: Three times a day (TID) | INTRAMUSCULAR | Status: DC
  Administered 2014-07-10 – 2014-07-12 (×8): 5000 [IU] via SUBCUTANEOUS
  Filled 2014-07-10 (×10): qty 1

## 2014-07-10 MED ORDER — SIMVASTATIN 10 MG PO TABS
10.0000 mg | ORAL_TABLET | Freq: Every day | ORAL | Status: DC
  Administered 2014-07-10 – 2014-07-12 (×3): 10 mg via ORAL
  Filled 2014-07-10 (×3): qty 1

## 2014-07-10 MED ORDER — ONDANSETRON HCL 4 MG/2ML IJ SOLN
4.0000 mg | Freq: Four times a day (QID) | INTRAMUSCULAR | Status: DC | PRN
Start: 2014-07-10 — End: 2014-07-12

## 2014-07-10 MED ORDER — SORBITOL 70 % SOLN
960.0000 mL | TOPICAL_OIL | Freq: Once | ORAL | Status: AC
  Administered 2014-07-10: 960 mL via RECTAL
  Filled 2014-07-10: qty 240

## 2014-07-10 MED ORDER — SODIUM CHLORIDE 0.9 % IJ SOLN
3.0000 mL | Freq: Two times a day (BID) | INTRAMUSCULAR | Status: DC
  Administered 2014-07-10 – 2014-07-12 (×5): 3 mL via INTRAVENOUS

## 2014-07-10 MED ORDER — MOMETASONE FURO-FORMOTEROL FUM 100-5 MCG/ACT IN AERO
2.0000 | INHALATION_SPRAY | Freq: Two times a day (BID) | RESPIRATORY_TRACT | Status: DC
  Administered 2014-07-10 – 2014-07-12 (×5): 2 via RESPIRATORY_TRACT
  Filled 2014-07-10: qty 8.8

## 2014-07-10 MED ORDER — ASPIRIN 81 MG PO CHEW
81.0000 mg | CHEWABLE_TABLET | Freq: Every day | ORAL | Status: DC
  Administered 2014-07-10 – 2014-07-12 (×3): 81 mg via ORAL
  Filled 2014-07-10 (×5): qty 1

## 2014-07-10 MED ORDER — HYDRALAZINE HCL 20 MG/ML IJ SOLN: 10.0000 mg | INTRAMUSCULAR | Status: DC | PRN

## 2014-07-10 MED ORDER — FUROSEMIDE 10 MG/ML IJ SOLN
20.0000 mg | Freq: Once | INTRAMUSCULAR | Status: AC
  Administered 2014-07-10: 20 mg via INTRAVENOUS
  Filled 2014-07-10: qty 2

## 2014-07-10 MED ORDER — DEXTROSE 5 % IV SOLN
2.0000 g | Freq: Two times a day (BID) | INTRAVENOUS | Status: DC
  Administered 2014-07-10 – 2014-07-12 (×6): 2 g via INTRAVENOUS
  Filled 2014-07-10 (×7): qty 2

## 2014-07-10 MED ORDER — ENSURE PO LIQD
237.0000 mL | Freq: Three times a day (TID) | ORAL | Status: DC
  Administered 2014-07-10 – 2014-07-12 (×6): 237 mL via ORAL
  Filled 2014-07-10 (×11): qty 237

## 2014-07-10 MED ORDER — LISINOPRIL 40 MG PO TABS
40.0000 mg | ORAL_TABLET | Freq: Every day | ORAL | Status: DC
  Administered 2014-07-10 – 2014-07-12 (×3): 40 mg via ORAL
  Filled 2014-07-10 (×3): qty 1

## 2014-07-10 MED ORDER — IOHEXOL 300 MG/ML  SOLN
100.0000 mL | Freq: Once | INTRAMUSCULAR | Status: AC | PRN
  Administered 2014-07-10: 100 mL via INTRAVENOUS

## 2014-07-10 MED ORDER — ACETAMINOPHEN 650 MG RE SUPP: 650.0000 mg | Freq: Four times a day (QID) | RECTAL | Status: DC | PRN

## 2014-07-10 MED ORDER — VANCOMYCIN HCL IN DEXTROSE 1-5 GM/200ML-% IV SOLN
1000.0000 mg | Freq: Two times a day (BID) | INTRAVENOUS | Status: DC
  Administered 2014-07-10 – 2014-07-12 (×5): 1000 mg via INTRAVENOUS
  Filled 2014-07-10 (×6): qty 200

## 2014-07-10 MED ORDER — LEVOTHYROXINE SODIUM 75 MCG PO TABS
75.0000 ug | ORAL_TABLET | Freq: Every day | ORAL | Status: DC
  Administered 2014-07-10 – 2014-07-12 (×3): 75 ug via ORAL
  Filled 2014-07-10 (×4): qty 1

## 2014-07-10 NOTE — Progress Notes (Signed)
Report called and given by [facility] RN Lauren. Room ready for admission.

## 2014-07-10 NOTE — Progress Notes (Signed)
ANTIBIOTIC CONSULT NOTE - INITIAL  Pharmacy Consult for vancomycin and cefepime Indication: wound infection  No Known Allergies  Patient Measurements: Weight: 216 lb 4.3 oz (98.1 kg)  Vital Signs: Temp: 98.5 F (36.9 C) (05/11 0307) Temp Source: Oral (05/11 0307) BP: 140/73 mmHg (05/11 0307) Pulse Rate: 65 (05/11 0307)  Labs:  Recent Labs  07/09/14 2130  WBC 11.2*  HGB 12.7  PLT 398  CREATININE 0.76    Medical History: Past Medical History  Diagnosis Date  . Hypertension   . Stroke   . COPD (chronic obstructive pulmonary disease)   . Hypothyroidism   . Hyperlipidemia   . Edema   . CHF (congestive heart failure)   . Gout   . OP (osteoporosis)     Medications:  Prescriptions prior to admission  Medication Sig Dispense Refill Last Dose  . aspirin 81 MG tablet Take 81 mg by mouth daily.   07/09/2014 at Unknown time  . calcium-vitamin D (OSCAL WITH D) 500-200 MG-UNIT per tablet Take 1 tablet by mouth 3 (three) times daily.   07/08/2014 at 1600  . Cholecalciferol (VITAMIN D3) 1000 UNITS CAPS Take 2,000 Units by mouth daily. 2 by mouth daily   07/09/2014 at Unknown time  . Fluticasone-Salmeterol (ADVAIR DISKUS) 100-50 MCG/DOSE AEPB Inhale 1 puff into the lungs 2 (two) times daily.   07/09/2014 at Unknown time  . levothyroxine (SYNTHROID, LEVOTHROID) 75 MCG tablet Take 75 mcg by mouth daily before breakfast. For Hypothyroidism   07/09/2014 at Unknown time  . lisinopril (PRINIVIL,ZESTRIL) 40 MG tablet Take 40 mg by mouth daily. For HTN   07/09/2014 at Unknown time  . metoprolol succinate (TOPROL-XL) 100 MG 24 hr tablet Take 100 mg by mouth daily. Take with or immediately following a meal.   07/09/2014 at 0900  . simvastatin (ZOCOR) 10 MG tablet Take 10 mg by mouth daily. For Hyperlipidemia   07/08/2014 at Unknown time  . spironolactone (ALDACTONE) 25 MG tablet Take 25 mg by mouth daily.   07/09/2014 at Unknown time   Scheduled:  . aspirin  81 mg Oral Daily  . furosemide  20 mg  Intravenous Once  . heparin  5,000 Units Subcutaneous 3 times per day  . levothyroxine  75 mcg Oral QAC breakfast  . lisinopril  40 mg Oral Daily  . metoprolol succinate  100 mg Oral Daily  . mometasone-formoterol  2 puff Inhalation BID  . simvastatin  10 mg Oral Daily  . sodium chloride  3 mL Intravenous Q12H    Assessment: 74[facility]/ [facility]ren[facility] toe, Xray shows no bony abnormalities, had started 5/10 on doxycycline, to begin IV ABX while awaiting vascular consult.  Goal of Therapy:  Vancomycin trough level 10-15 mcg/ml  Plan:  Rec'd vanc 1g IV in ED; will continue with vancomycin 1000mg  IV Q12H and begin cefepime 2g IV Q12H and monitor CBC, Cx, levels prn.  Vernard GamblesVeronda Britton Perkinson, PharmD, BCPS  07/10/2014,3:10 AM

## 2014-07-10 NOTE — Progress Notes (Signed)
New Admission Note:   Arrival Method: [facility] Orientation: alert, oriented X4, conversant Telemetry: none ordered Assessment: Completed Skin: warm, dry, flaky heels and feet with swelling on the right foot, necrosis at the back of the right great toe and 4th toe, open wound on the tip of the left great toe. Bruise on the right arm. Bottom is intact, no other open wound noted. IV: G24 on the left hand with transparent dressing, clean, dry and intact Pain: denies having pain as of this time Tubes: none Safety Measures: Safety Fall Prevention Plan has been given, discussed and signed Admission: Completed [address] Orientation: Patient has been orientated to the room, unit and staff.  Family: no family member at bedside  Orders have been reviewed and implemented. Will continue to monitor the patient. Call light has been placed within reach and bed alarm has been activated.   Janice NorrieAnessa Yashar Inclan BSN, RN [facility] [place] [phone]

## 2014-07-10 NOTE — Consult Note (Addendum)
[facility] [place] HISTORY AND PHYSICAL   History of Present Illness:  Patient is a 74 y.o. year old female who presents for evaluation of gangrene right first toe with pain.  The patient has some dementia and does not know how the problem in her toe started.  She complains of intermittent pain but also has chronic numbness in the toe.  No fever or chills.  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  Her daughter is her POA and was present for the interview.  Other medical problems include hypertension, prior CVA, COPD, hyperlipidemia, CHF, ongoing tobacco abuse.  She currently resides at [facility] and has for several years.  Past Medical History  Diagnosis Date  . Hypertension   . Stroke   . COPD (chronic obstructive pulmonary disease)   . Hypothyroidism   . Hyperlipidemia   . Edema   . CHF (congestive heart failure)   . Gout   . OP (osteoporosis)     Past Surgical History  Procedure Laterality Date  . Abdominal hysterectomy      Social History History  Substance Use Topics  . Smoking status: Current Every Day Smoker  . Smokeless tobacco: Not on file  . Alcohol Use: No    Family History Family History  Problem Relation Age of Onset  . Colon cancer Mother   . Ovarian cancer Mother   . Emphysema Sister   . Heart failure Daughter     Allergies  No Known Allergies   Current Facility-Administered Medications  Medication Dose Route Frequency Provider Last Rate Last Dose  . acetaminophen (TYLENOL) tablet 650 mg  650 mg Oral Q6H PRN Arshad N Kakrakandy, MD       Or  . acetaminophen (TYLENOL) suppository 650 mg  650 mg Rectal Q6H PRN Arshad N Kakrakandy, MD      . aspirin chewable tablet 81 mg  81 mg Oral Daily Arshad N Kakrakandy, MD      . ceFEPIme (MAXIPIME) 2 g in dextrose 5 % 50 mL IVPB  2 g Intravenous Q12H Veronda P Bryk, RPH   2 g at 07/10/14 0424  . heparin injection 5,000 Units  5,000 Units  Subcutaneous 3 times per day Arshad N Kakrakandy, MD   5,000 Units at 07/10/14 0606  . hydrALAZINE (APRESOLINE) injection 10 mg  10 mg Intravenous Q4H PRN Arshad N Kakrakandy, MD      . levothyroxine (SYNTHROID, LEVOTHROID) tablet 75 mcg  75 mcg Oral QAC breakfast Arshad N Kakrakandy, MD   75 mcg at 07/10/14 0807  . lisinopril (PRINIVIL,ZESTRIL) tablet 40 mg  40 mg Oral Daily Arshad N Kakrakandy, MD      . metoprolol succinate (TOPROL-XL) 24 hr tablet 100 mg  100 mg Oral Daily Arshad N Kakrakandy, MD      . mometasone-formoterol (DULERA) 100-5 MCG/ACT inhaler 2 puff  2 puff Inhalation BID Arshad N Kakrakandy, MD      . ondansetron (ZOFRAN) tablet 4 mg  4 mg Oral Q6H PRN Arshad N Kakrakandy, MD       Or  . ondansetron (ZOFRAN) injection 4 mg  4 mg Intravenous Q6H PRN Arshad N Kakrakandy, MD      . simvastatin (ZOCOR) tablet 10 mg  10 mg Oral Daily Arshad N Kakrakandy, MD      . sodium chloride 0.9 % injection 3 mL  3 mL Intravenous Q12H Arshad N Kakrakandy, MD   3 mL   at 07/10/14 0424  . vancomycin (VANCOCIN) IVPB 1000 mg/200 mL premix  1,000 mg Intravenous Q12H Veronda P Bryk, RPH        ROS:   Unable to obtain secondary to dementia  Physical Examination  Filed Vitals:   07/09/14 2330 07/10/14 0000 07/10/14 0028 07/10/14 0307  BP: 135/71 142/92 178/69 140/73  Pulse: 65 51 67 65  Temp:    98.5 F (36.9 C)  TempSrc:    Oral  Resp: 23  20 18   Weight:    216 lb 4.3 oz (98.1 kg)  SpO2: 100% 84% 100% 99%    There is no height on file to calculate BMI.  General:  Alert and oriented, no acute distress HEENT: Normal Neck: No JVD Pulmonary: Clear to auscultation bilaterally Cardiac: Regular Rate and Rhythm  Abdomen: Soft, non-tender, non-distended, ? Large lower abdominal mass vs pt resistance, well healed lower midline scar Skin: No rash, dry gangrene right first toe, thick callus left first toe Extremity Pulses:  2+ radial, brachial,1-2+ femoral absent popliteal dorsalis pedis,  posterior tibial pulses bilaterally Musculoskeletal: No deformity trace right leg edema  Neurologic: Upper and lower extremity motor 5/5 right side, chronic flexion contracture left arm, left leg flaccid weakness  DATA: CBC    Component Value Date/Time   WBC 9.8 07/10/2014 0655   WBC 9.7 03/28/2014   RBC 4.14 07/10/2014 0655   HGB 12.5 07/10/2014 0655   HCT 35.8* 07/10/2014 0655   PLT 401* 07/10/2014 0655   MCV 86.5 07/10/2014 0655   MCH 30.2 07/10/2014 0655   MCHC 34.9 07/10/2014 0655   RDW 13.4 07/10/2014 0655   LYMPHSABS 1.8 07/09/2014 2130   MONOABS 1.3* 07/09/2014 2130   EOSABS 0.1 07/09/2014 2130   BASOSABS 0.0 07/09/2014 2130    BMET    Component Value Date/Time   NA 127* 07/10/2014 0655   K 4.0 07/10/2014 0655   CL 94* 07/10/2014 0655   CO2 25 07/10/2014 0655   GLUCOSE 98 07/10/2014 0655   BUN 6 07/10/2014 0655   BUN 10 03/28/2014   CREATININE 0.81 07/10/2014 0655   CREATININE 0.8 03/28/2014   CALCIUM 9.0 07/10/2014 0655   GFRNONAA >60 07/10/2014 0655   GFRAA >60 07/10/2014 0655    ASSESSMENT/PLAN:  1. Right first toe gangrene.  Pt not really a bypass candidate.  Discussed situation with pt daughter, rather than proceeding with arteriogram which most likely would not reveal any percutaneous options in a patient that is essentially wheelchair bound.  They have opted for right above knee amputation.  This will be scheduled for Monday May 16.  2. Possible abdominal mass will obtain CT abdomen/pelvis to rule out malignant process especially in light of the fact that pt uterus has been removed and mass is primarily lower abdomen  3. Hyponatremia per primary team  4. Protein Calorie malnutrition Albumin 3.1 needs nutrition supplement to improve wound healing potential  Fabienne Brunsharles Fields, MD [facility] [place] Office: [phone] Pager: [phone]

## 2014-07-10 NOTE — Progress Notes (Signed)
[facility] PROGRESS NOTE   Jolyn LentVera Glynn VHQ:[id number] DOB: [birth year] DOA: 07/09/2014 PCP: No primary care provider on file.  HPI/Subjective: Denies any pain, denies any complaints. No fever or chills overnight, seen by Dr. Darrick Pennafields earlier.  Assessment/Plan: Active Problems:   Essential hypertension, benign   COPD (chronic obstructive pulmonary disease)   Gangrene of toe   Cellulitis of right foot   Hyponatremia   Hypertension   This is a no charge note Patient presented with gangrenous right great toe and right fourth and fifth toes. Seen by vascular surgery recommended right ABA. Hyponatremia, improving with diuresis. If sodium improve she might be discharging comes back in Monday morning for the surgery. She lives in a nursing home and it's not going to be a problem for transportation/compliance.  Code Status: Full Code Family Communication: Plan discussed with the patient. Disposition Plan: Remains inpatient Diet: Diet Heart Room service appropriate?: Yes; Fluid consistency:: Thin; Fluid restriction:: 1200 mL Fluid  Consultants:  None  Procedures:  None  Antibiotics:  None   Objective: Filed Vitals:   07/10/14 1002  BP: 127/56  Pulse: 66  Temp: 98.6 F (37 C)  Resp: 18    Intake/Output Summary (Last 24 hours) at 07/10/14 1601 Last data filed at 07/10/14 1356  Gross per 24 hour  Intake    230 ml  Output     50 ml  Net    180 ml   Filed Weights   07/10/14 0307  Weight: 98.1 kg (216 lb 4.3 oz)    Exam: General: Alert and awake, oriented x3, not in any acute distress. HEENT: anicteric sclera, pupils reactive to light and accommodation, EOMI CVS: S1-S2 clear, no murmur rubs or gallops Chest: clear to auscultation bilaterally, no wheezing, rales or rhonchi Abdomen: soft nontender, nondistended, normal bowel sounds, no organomegaly Extremities: Right gangrenous toes including first, fourth and fifth toe. Neuro: Cranial nerves II-XII  intact, no focal neurological deficits  Data Reviewed: Basic Metabolic Panel:  Recent Labs Lab 07/09/14 2130 07/10/14 0434 07/10/14 0655 07/10/14 1031  NA 123* 126* 127* 127*  K 4.6 4.1 4.0 4.3  CL 92* 95* 94* 94*  CO2 25 20* 25 25  GLUCOSE 98 119* 98 111*  BUN 9 6 6 7   CREATININE 0.76 0.79 0.81 0.73  CALCIUM 9.5 8.9 9.0 9.0   Liver Function Tests:  Recent Labs Lab 07/10/14 0434  AST 24  ALT 18  ALKPHOS 77  BILITOT 0.7  PROT 6.2*  ALBUMIN 3.1*   No results for input(s): LIPASE, AMYLASE in the last 168 hours. No results for input(s): AMMONIA in the last 168 hours. CBC:  Recent Labs Lab 07/09/14 2130 07/10/14 0434 07/10/14 0655  WBC 11.2* 9.4 9.8  NEUTROABS 8.0*  --   --   HGB 12.7 11.8* 12.5  HCT 36.4 33.3* 35.8*  MCV 88.1 86.5 86.5  PLT 398 390 401*   Cardiac Enzymes: No results for input(s): CKTOTAL, CKMB, CKMBINDEX, TROPONINI in the last 168 hours. BNP (last 3 results) No results for input(s): BNP in the last 8760 hours.  ProBNP (last 3 results) No results for input(s): PROBNP in the last 8760 hours.  CBG: No results for input(s): GLUCAP in the last 168 hours.  Micro No results found for this or any previous visit (from the past 240 hour(s)).   Studies: Ct Abdomen Pelvis W Contrast  07/10/2014   CLINICAL DATA:  Questionable large lower abdominal mass on exam.  EXAM: CT ABDOMEN AND PELVIS WITH CONTRAST  TECHNIQUE: Multidetector CT imaging of the abdomen and pelvis was performed using the standard protocol following bolus administration of intravenous contrast.  CONTRAST:  OMNIPAQUE IOHEXOL 300 MG/ML  SOLN  COMPARISON:  None.  FINDINGS: There is a large partly calcified heterogeneous mass arising from the pelvis into the lower to central abdomen. Mass measures 20 cm by 15 cm x 10 cm. Mass extends from the level of the upper vagina indenting the superior bladder. No normal uterus is seen. This is presumed to be the uterus enlarged by numerous  fibroids.  Neither ovary is discretely seen. No other abdominal or pelvic masses.  There is moderate to marked distention of the rectum with stool. Mild to moderate increased stool is seen throughout the colon. There is no bowel distention to suggest obstruction. There is no bowel wall thickening or mesenteric inflammation.  Lung bases show minor subsegmental atelectasis. Heart is normal in size.  Liver, spleen, gallbladder, pancreas and adrenal glands: Unremarkable.  Probable small cyst from the anterior upper pole of the left kidney. No other renal abnormality. Normal ureters. Bladder is unremarkable.  Mildly prominent inguinal lymph nodes, largest actually along the inferior external iliac chain measuring 12 mm in short axis. These are most likely all reactive.  No ascites.  Dense atherosclerotic calcifications noted along a normal caliber abdominal aorta and its branch vessels.  Degenerative changes noted of the lower lumbar spine. No osteoblastic or osteolytic lesions.  IMPRESSION: 1. 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 2. No acute findings within the abdomen pelvis. No findings suspicious for malignancy. 3. There is significant distention of the rectum with stool with mild to moderate increased stool throughout the colon.   Electronically Signed   By: Amie Portland M.D.   On: 07/10/2014 15:27   Dg Chest Port 1 View  07/10/2014   CLINICAL DATA:  Hyponatremia. History of hypertension, stroke, COPD, CHF. Current smoker.  EXAM: PORTABLE CHEST - 1 VIEW  COMPARISON:  None.  FINDINGS: Normal heart size and pulmonary vascularity. No focal airspace disease or consolidation in the lungs. No blunting of costophrenic angles. No pneumothorax. Mediastinal contours appear intact. Calcified and tortuous aorta. Degenerative changes in the spine and shoulders. Mild thoracic scoliosis convex towards the right.  IMPRESSION: No active disease.   Electronically Signed   By:  Burman Nieves M.D.   On: 07/10/2014 00:22   Dg Foot Complete Right  07/09/2014   CLINICAL DATA:  Right foot pain. Black areas of necrosis in the first and fourth toes. History of gout, osteoporosis, and edema. Swelling.  EXAM: RIGHT FOOT COMPLETE - 3+ VIEW  COMPARISON:  None.  FINDINGS: Diffuse bone demineralization. Mild soft tissue swelling over the forefoot. Degenerative changes in the interphalangeal joints and first metacarpal phalangeal joint. Small plantar calcaneal spur. Suggestion of mild ostial ice this at the first distal phalangeal tuft. No discrete cortical loss. No evidence of acute fracture or dislocation. 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  IMPRESSION: Degenerative changes in the right foot with diffuse demineralization. Soft tissue swelling. No acute bony abnormalities.   Electronically Signed   By: Burman Nieves M.D.   On: 07/09/2014 21:57    Scheduled Meds: . aspirin  81 mg Oral Daily  . ceFEPime (MAXIPIME) IV  2 g Intravenous Q12H  . ENSURE  237 mL Oral TID WC  . heparin  5,000 Units Subcutaneous 3 times per day  . levothyroxine  75 mcg Oral QAC breakfast  . lisinopril  40 mg Oral Daily  . metoprolol succinate  100 mg Oral Daily  . mometasone-formoterol  2 puff Inhalation BID  . simvastatin  10 mg Oral Daily  . sodium chloride  3 mL Intravenous Q12H  . vancomycin  1,000 mg Intravenous Q12H   Continuous Infusions:      Time spent: 35 minutes    [facility],Lashana Spang A  [facility] Pager [phone] If 7PM-7AM, please contact night-coverage at [email or web address], password [email or web address] 07/10/2014, 4:01 PM  LOS: 1 day

## 2014-07-10 NOTE — H&P (Signed)
[facility] History and Physical  Suzanne LentVera Getman ZOX:[id number] DOB: 1940/07/15 DOA: 07/09/2014  Referring physician: Dr. Freida BusmanAllen. ER physician. PCP: No primary care provider on file. Dr. Glade LloydPandey. Specialists: None.  Chief Complaint: Right leg gangrenous toe.  HPI: Suzanne Rich is a 74 y.o. female with history of stroke with left-sided hemiparesis, ongoing tobacco abuse and COPD, hypertension and hyperlipidemia was referred to the ER after patient was found to have gangrenous toes. Patient states that she has been having some issues with her toes over the last few weeks but over the last 1 week patient started having increasing pain and swelling in the right foot and toes. Patient's PCP found that patient was having gangrenous great toes with swelling and was referred to the ER. On exam patient has gangrenous toe that right foot involving the greater toe and last 2 toes. Patient's dorsalis pedis artery and posterior tibial artery are dopplerable. Patient denies any fever or chills. On exam patient's foot is erythematous. Patient also has edema over the both lower extremities. Labs reveal hyponatremia. Patient denies any chest pain or shortness of breath.   Review of Systems: As presented in the history of presenting illness, rest negative.  Past Medical History  Diagnosis Date  . Hypertension   . Stroke   . COPD (chronic obstructive pulmonary disease)   . Hypothyroidism   . Hyperlipidemia   . Edema   . CHF (congestive heart failure)   . Gout   . OP (osteoporosis)    Past Surgical History  Procedure Laterality Date  . Abdominal hysterectomy     Social History:  reports that she has been smoking.  She does not have any smokeless tobacco history on file. She reports that she does not drink alcohol. Her drug history is not on file. Where does patient live nursing home. Can patient participate in ADLs? No.  No Known Allergies  Family History:  Family History  Problem Relation Age  of Onset  . Colon cancer Mother   . Ovarian cancer Mother   . Emphysema Sister   . Heart failure Daughter       Prior to Admission medications   Medication Sig Start Date End Date Taking? Authorizing Provider  aspirin 81 MG tablet Take 81 mg by mouth daily.   Yes Historical Provider, MD  calcium-vitamin D (OSCAL WITH D) 500-200 MG-UNIT per tablet Take 1 tablet by mouth 3 (three) times daily.   Yes Historical Provider, MD  Cholecalciferol (VITAMIN D3) 1000 UNITS CAPS Take 2,000 Units by mouth daily. 2 by mouth daily   Yes Historical Provider, MD  Fluticasone-Salmeterol (ADVAIR DISKUS) 100-50 MCG/DOSE AEPB Inhale 1 puff into the lungs 2 (two) times daily.   Yes Historical Provider, MD  levothyroxine (SYNTHROID, LEVOTHROID) 75 MCG tablet Take 75 mcg by mouth daily before breakfast. For Hypothyroidism   Yes Historical Provider, MD  lisinopril (PRINIVIL,ZESTRIL) 40 MG tablet Take 40 mg by mouth daily. For HTN   Yes Historical Provider, MD  metoprolol succinate (TOPROL-XL) 100 MG 24 hr tablet Take 100 mg by mouth daily. Take with or immediately following a meal.   Yes Historical Provider, MD  simvastatin (ZOCOR) 10 MG tablet Take 10 mg by mouth daily. For Hyperlipidemia   Yes Historical Provider, MD  spironolactone (ALDACTONE) 25 MG tablet Take 25 mg by mouth daily.   Yes Historical Provider, MD    Physical Exam: Filed Vitals:   07/09/14 2300 07/09/14 2301 07/09/14 2330 07/10/14 0000  BP: 158/89 158/89 135/71 142/92  Pulse: 66 69 65 51  Temp:      TempSrc:      Resp: SpO2: 100% 100% 100% 84%     General:  Moderately built and poorly nourished.  Eyes: Anicteric. No pallor.  ENT: No discharge from the ears eyes nose and mouth.  Neck: No mass felt.  Cardiovascular: S1-S2 heard.  Respiratory: No rhonchi or crepitations.  Abdomen: Soft nontender bowel sounds present.  Skin: Erythema of the right foot with first and fourth and fifth toes gangrenous. Discolored second and  third toes. Pulses felt on Dopplers.  Musculoskeletal: Bilateral lower extremity edema more on the right side.  Psychiatric: Appears normal.  Neurologic: Alert awake oriented to time place and person. Left-sided hemiparesis.  Labs on Admission:  Basic Metabolic Panel:  Recent Labs Lab 07/09/14 2130  NA 123*  K 4.6  CL 92*  CO2 25  GLUCOSE 98  BUN 9  CREATININE 0.76  CALCIUM 9.5   Liver Function Tests: No results for input(s): AST, ALT, ALKPHOS, BILITOT, PROT, ALBUMIN in the last 168 hours. No results for input(s): LIPASE, AMYLASE in the last 168 hours. No results for input(s): AMMONIA in the last 168 hours. CBC:  Recent Labs Lab 07/09/14 2130  WBC 11.2*  NEUTROABS 8.0*  HGB 12.7  HCT 36.4  MCV 88.1  PLT 398   Cardiac Enzymes: No results for input(s): CKTOTAL, CKMB, CKMBINDEX, TROPONINI in the last 168 hours.  BNP (last 3 results) No results for input(s): BNP in the last 8760 hours.  ProBNP (last 3 results) No results for input(s): PROBNP in the last 8760 hours.  CBG: No results for input(s): GLUCAP in the last 168 hours.  Radiological Exams on Admission: Dg Foot Complete Right  07/09/2014   CLINICAL DATA:  Right foot pain. Black areas of necrosis in the first and fourth toes. History of gout, osteoporosis, and edema. Swelling.  EXAM: RIGHT FOOT COMPLETE - 3+ VIEW  COMPARISON:  None.  FINDINGS: Diffuse bone demineralization. Mild soft tissue swelling over the forefoot. Degenerative changes in the interphalangeal joints and first metacarpal phalangeal joint. Small plantar calcaneal spur. Suggestion of mild ostial ice this at the first distal phalangeal tuft. No discrete cortical loss. No evidence of acute fracture or dislocation. 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  IMPRESSION: Degenerative changes in the right foot with diffuse demineralization. Soft tissue swelling. No acute bony abnormalities.   Electronically Signed   By: Burman Nieves M.D.    On: 07/09/2014 21:57    EKG: Independently reviewed. Normal sinus rhythm.  Assessment/Plan Active Problems:   Essential hypertension, benign   COPD (chronic obstructive pulmonary disease)   Gangrene of toe   Cellulitis of right foot   Hyponatremia   Hypertension   1. Gangrenous toes of the right foot with cellulitis - I have discussed with on-call vascular surgeon Dr. Darrick Penna who will be seeing patient in consult. At this time I have placed patient on vancomycin and cefepime for possible cellulitis of the right foot. Continued pain relief medications and check Dopplers to rule out DVT. 2. Hyponatremia - patient's charts revealed that patient has history of CHF. Patient is also on spironolactone. Patient on exam does have bilateral lower extremity edema. At this time I think patient's hyponatremia could be possibly secondary to fluid overload for which I'm restricting fluid and I have ordered Lasix 20 mg. I have ordered urine sodium osmolality, serum osmolality, TSH, cortisol levels. Closely follow metabolic panel  and further recommendation based on sodium trends and tests ordered. 3. Hypertension - continue home medications. Holding her spironolactone and see #2. 4. History of CHF per the charts - EF not known. At this time holding her spironolactone due to hyponatremia and I have placed patient on Lasix further doses to be decided. 5. COPD presently not wheezing. Continue Advair Diskus. 6. Hypothyroidism continue Synthroid check TSH. 7. Tobacco abuse patient advised of quitting smoking. 8. History of stroke with left-sided hemiparesis on aspirin.  [facility] Dr. Allena KatzPatel will be the accepting physician. Patient and family agreeable to transfer. Patient is transferred for further vascular workup.   DVT Prophylaxis heparin.  Code Status: Full code.  Family Communication: Patient's daughter. Patient's daughter can be reached at [phone].   Disposition Plan: Admit to inpatient. Likely stay 3-4 days.    Eastyn Skalla N. [facility] Pager [phone].  If 7PM-7AM, please contact night-coverage [email or web address] Password [email or web address] 07/10/2014, 12:24 AM

## 2014-07-11 DIAGNOSIS — J439 Emphysema, unspecified: Secondary | ICD-10-CM

## 2014-07-11 LAB — BASIC METABOLIC PANEL
Anion gap: 6 (ref 5–15)
BUN: 6 mg/dL (ref 6–20)
CHLORIDE: 98 mmol/L — AB (ref 101–111)
CO2: 23 mmol/L (ref 22–32)
CREATININE: 0.72 mg/dL (ref 0.44–1.00)
Calcium: 8.7 mg/dL — ABNORMAL LOW (ref 8.9–10.3)
GFR calc Af Amer: >60 mL/min (ref >60)
GFR calc non Af Amer: >60 mL/min (ref >60)
Glucose, Bld: 94 mg/dL (ref 65–99)
Potassium: 4.2 mmol/L (ref 3.5–5.1)
Sodium: 127 mmol/L — ABNORMAL LOW (ref 135–145)

## 2014-07-11 MED ORDER — SODIUM CHLORIDE 0.9 % IV SOLN
INTRAVENOUS | Status: DC
  Administered 2014-07-11 – 2014-07-12 (×3): via INTRAVENOUS

## 2014-07-11 MED ORDER — FUROSEMIDE 10 MG/ML IJ SOLN
40.0000 mg | Freq: Once | INTRAMUSCULAR | Status: AC
  Administered 2014-07-11: 40 mg via INTRAVENOUS
  Filled 2014-07-11: qty 4

## 2014-07-11 NOTE — Progress Notes (Signed)
Spoke with patient's daughter to confirm whether she had her uterus removed. Daughter confirmed she did in fact have her uterus removed years ago.

## 2014-07-11 NOTE — Consult Note (Signed)
[facility]  Subjective  - feels ok  Objective 107/61 63 99.5 F (37.5 C) (Oral) 16 97%  Intake/Output Summary (Last 24 hours) at 07/11/14 0924 Last data filed at 07/11/14 [id number]  Gross per 24 hour  Intake   1030 ml  Output      1 ml  Net   1029 ml   Right foot unchanged   Data: CT abdomen images reviewed, large mass in pelvis ? Fibroids however pt states she [facility]  Assessment/Planning: Right first toe gangrene, Right AKA Monday  Abdominal mass ? Fibroid but pt states had hysterectomy.  Pt has dementia so will need to clarify with her daughter if uterus is absent.  If she truly had hysterectomy in the past this may need further workup  Hyponatremia per primary team  Fabienne BrunsFields, Wilburn Keir 07/11/2014 9:24 AM --  Laboratory Lab Results:  Recent Labs  07/10/14 0434 07/10/14 0655  WBC 9.4 9.8  HGB 11.8* 12.5  HCT 33.3* 35.8*  PLT 390 401*   BMET  Recent Labs  07/10/14 1031 07/11/14 0545  NA 127* 127*  K 4.3 4.2  CL 94* 98*  CO2 25 23  GLUCOSE 111* 94  BUN 7 6  CREATININE 0.73 0.72  CALCIUM 9.0 8.7*    COAG No results found for: INR, PROTIME No results found for: PTT

## 2014-07-11 NOTE — Clinical Social Work Note (Signed)
CSW received consult for patient as [facility]. CSW will follow-up with patient/family to confirm discharge plans.   Genelle BalVanessa Elissia Spiewak, MSW, LCSW Licensed Clinical Social Worker Clinical Social Work Department [facility] [phone]

## 2014-07-11 NOTE — Progress Notes (Signed)
[facility] PROGRESS NOTE   Suzanne Rich WJX:[id number] DOB: December 08, 1940 DOA: 07/09/2014 PCP: No primary care provider on file.  HPI/Subjective: Denies any complaints, spoke with her daughter over the phone last night. No acute complaints. Patient can be discharged to the nursing home in a.m. come back for surgery Monday morning.  Assessment/Plan: Active Problems:   Essential hypertension, benign   COPD (chronic obstructive pulmonary disease)   Gangrene of toe   Cellulitis of right foot   Hyponatremia   Hypertension   Gangrenous toes Dry gangrene involving the right great, fourth and fifth toes. Seen by Dr. Darrick Penna of vascular surgery, he was discussed with the daughter about amputation. Patient is scheduled for right above-knee amputation scheduled on Monday, 07/15/2014.  Hyponatremia Acute on chronic hyponatremia, according to nursing home records run sodium of 130 2032. Patient does have history of CHF, her urine sodium is elevated and plasma osmole is low. Getting Lasix, was on salt tablets in the nursing home, continue Lasix.  History of stroke with left-sided hemiparesis No acute issues, continue aspirin. Patient is bed ridden since her stroke.  History of CHF  LVEF is unknown, patient was on Lasix and spironolactone as outpatient, held recently. Patient looked euvolemic without clinical symptoms of decompensation.  Abdominal mass By exam there is lower abdominal palpable mass. CT scan was done and showed calcified uterine fibroiod.  Constipation CT scan showed severe constipation, this resolved after the smog enema.   Code Status: Full Code Family Communication: Plan discussed with the patient. Disposition Plan: Remains inpatient Diet: Diet Heart Room service appropriate?: Yes; Fluid consistency:: Thin; Fluid restriction:: 1200 mL Fluid  Consultants:  None  Procedures:  None  Antibiotics:  None   Objective: Filed Vitals:   07/11/14 0958    BP:   Pulse: 64  Temp:   Resp: 16    Intake/Output Summary (Last 24 hours) at 07/11/14 1538 Last data filed at 07/11/14 1330  Gross per 24 hour  Intake   1250 ml  Output      1 ml  Net   1249 ml   Filed Weights   07/10/14 0307 07/10/14 2205  Weight: 98.1 kg (216 lb 4.3 oz) 98.2 kg (216 lb 7.9 oz)    Exam: General: Alert and awake, oriented x3, not in any acute distress. HEENT: anicteric sclera, pupils reactive to light and accommodation, EOMI CVS: S1-S2 clear, no murmur rubs or gallops Chest: clear to auscultation bilaterally, no wheezing, rales or rhonchi Abdomen: soft nontender, nondistended, normal bowel sounds, no organomegaly Extremities: Right gangrenous toes including first, fourth and fifth toe. Neuro: Cranial nerves II-XII intact, no focal neurological deficits  Data Reviewed: Basic Metabolic Panel:  Recent Labs Lab 07/09/14 2130 07/10/14 0434 07/10/14 0655 07/10/14 1031 07/11/14 0545  NA 123* 126* 127* 127* 127*  K 4.6 4.1 4.0 4.3 4.2  CL 92* 95* 94* 94* 98*  CO2 25 20* GLUCOSE 98 119* 98 111* 94  BUN CREATININE 0.76 0.79 0.81 0.73 0.72  CALCIUM 9.5 8.9 9.0 9.0 8.7*   Liver Function Tests:  Recent Labs Lab 07/10/14 0434  AST 24  ALT 18  ALKPHOS 77  BILITOT 0.7  PROT 6.2*  ALBUMIN 3.1*   No results for input(s): LIPASE, AMYLASE in the last 168 hours. No results for input(s): AMMONIA in the last 168 hours. CBC:  Recent Labs Lab 07/09/14 2130 07/10/14 0434 07/10/14 0655  WBC 11.2* 9.4 9.8  NEUTROABS 8.0*  --   --  HGB 12.7 11.8* 12.5  HCT 36.4 33.3* 35.8*  MCV 88.1 86.5 86.5  PLT 398 390 401*   Cardiac Enzymes: No results for input(s): CKTOTAL, CKMB, CKMBINDEX, TROPONINI in the last 168 hours. BNP (last 3 results) No results for input(s): BNP in the last 8760 hours.  ProBNP (last 3 results) No results for input(s): PROBNP in the last 8760 hours.  CBG: No results for input(s): GLUCAP in the last 168  hours.  Micro No results found for this or any previous visit (from the past 240 hour(s)).   Studies: Ct Abdomen Pelvis W Contrast  07/10/2014   CLINICAL DATA:  Questionable large lower abdominal mass on exam.  EXAM: CT ABDOMEN AND PELVIS WITH CONTRAST  TECHNIQUE: Multidetector CT imaging of the abdomen and pelvis was performed using the standard protocol following bolus administration of intravenous contrast.  CONTRAST:  100mL OMNIPAQUE IOHEXOL 300 MG/ML  SOLN  COMPARISON:  None.  FINDINGS: There is a large partly calcified heterogeneous mass arising from the pelvis into the lower to central abdomen. Mass measures 20 cm by 15 cm x 10 cm. Mass extends from the level of the upper vagina indenting the superior bladder. No normal uterus is seen. This is presumed to be the uterus enlarged by numerous fibroids.  Neither ovary is discretely seen. No other abdominal or pelvic masses.  There is moderate to marked distention of the rectum with stool. Mild to moderate increased stool is seen throughout the colon. There is no bowel distention to suggest obstruction. There is no bowel wall thickening or mesenteric inflammation.  Lung bases show minor subsegmental atelectasis. Heart is normal in size.  Liver, spleen, gallbladder, pancreas and adrenal glands: Unremarkable.  Probable small cyst from the anterior upper pole of the left kidney. No other renal abnormality. Normal ureters. Bladder is unremarkable.  Mildly prominent inguinal lymph nodes, largest actually along the inferior external iliac chain measuring 12 mm in short axis. These are most likely all reactive.  No ascites.  Dense atherosclerotic calcifications noted along a normal caliber abdominal aorta and its branch vessels.  Degenerative changes noted of the lower lumbar spine. No osteoblastic or osteolytic lesions.  IMPRESSION: 1. 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 2. No acute findings within the  abdomen pelvis. No findings suspicious for malignancy. 3. There is significant distention of the rectum with stool with mild to moderate increased stool throughout the colon.   Electronically Signed   By: Amie Portlandavid  Ormond M.D.   On: 07/10/2014 15:27   Dg Chest Port 1 View  07/10/2014   CLINICAL DATA:  Hyponatremia. History of hypertension, stroke, COPD, CHF. Current smoker.  EXAM: PORTABLE CHEST - 1 VIEW  COMPARISON:  None.  FINDINGS: Normal heart size and pulmonary vascularity. No focal airspace disease or consolidation in the lungs. No blunting of costophrenic angles. No pneumothorax. Mediastinal contours appear intact. Calcified and tortuous aorta. Degenerative changes in the spine and shoulders. Mild thoracic scoliosis convex towards the right.  IMPRESSION: No active disease.   Electronically Signed   By: Burman NievesWilliam  Stevens M.D.   On: 07/10/2014 00:22   Dg Foot Complete Right  07/09/2014   CLINICAL DATA:  Right foot pain. Black areas of necrosis in the first and fourth toes. History of gout, osteoporosis, and edema. Swelling.  EXAM: RIGHT FOOT COMPLETE - 3+ VIEW  COMPARISON:  None.  FINDINGS: Diffuse bone demineralization. Mild soft tissue swelling over the forefoot. Degenerative  changes in the interphalangeal joints and first metacarpal phalangeal joint. Small plantar calcaneal spur. Suggestion of mild ostial ice this at the first distal phalangeal tuft. No discrete cortical loss. No evidence of acute fracture or dislocation. 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  IMPRESSION: Degenerative changes in the right foot with diffuse demineralization. Soft tissue swelling. No acute bony abnormalities.   Electronically Signed   By: Burman NievesWilliam  Stevens M.D.   On: 07/09/2014 21:57    Scheduled Meds: . aspirin  81 mg Oral Daily  . ceFEPime (MAXIPIME) IV  2 g Intravenous Q12H  . ENSURE  237 mL Oral TID WC  . heparin  5,000 Units Subcutaneous 3 times per day  . levothyroxine  75 mcg Oral QAC breakfast   . lisinopril  40 mg Oral Daily  . metoprolol succinate  100 mg Oral Daily  . mometasone-formoterol  2 puff Inhalation BID  . simvastatin  10 mg Oral Daily  . sodium chloride  3 mL Intravenous Q12H  . vancomycin  1,000 mg Intravenous Q12H   Continuous Infusions:      Time spent: 35 minutes    [facility],Alvey Brockel A  [facility] Pager [phone] If 7PM-7AM, please contact night-coverage at [email or web address], password [email or web address] 07/11/2014, 3:38 PM  LOS: 2 days

## 2014-07-12 LAB — BASIC METABOLIC PANEL
ANION GAP: 6 (ref 5–15)
BUN: 7 mg/dL (ref 6–20)
CALCIUM: 8.6 mg/dL — AB (ref 8.9–10.3)
CO2: 23 mmol/L (ref 22–32)
Chloride: 99 mmol/L — ABNORMAL LOW (ref 101–111)
Creatinine, Ser: 0.73 mg/dL (ref 0.44–1.00)
GFR calc Af Amer: >60 mL/min (ref >60)
GFR calc non Af Amer: >60 mL/min (ref >60)
Glucose, Bld: 123 mg/dL — ABNORMAL HIGH (ref 65–99)
Potassium: 4.3 mmol/L (ref 3.5–5.1)
Sodium: 128 mmol/L — ABNORMAL LOW (ref 135–145)

## 2014-07-12 MED ORDER — POLYETHYLENE GLYCOL 3350 17 G PO PACK: 17.0000 g | PACK | Freq: Every day | ORAL | Status: AC

## 2014-07-12 MED ORDER — DOXYCYCLINE HYCLATE 100 MG PO TABS: 100.0000 mg | ORAL_TABLET | Freq: Two times a day (BID) | ORAL | Status: DC

## 2014-07-12 MED ORDER — LISINOPRIL 10 MG PO TABS: 10.0000 mg | ORAL_TABLET | Freq: Every day | ORAL | Status: DC

## 2014-07-12 MED ORDER — CEPHALEXIN 500 MG PO CAPS: 500.0000 mg | ORAL_CAPSULE | Freq: Three times a day (TID) | ORAL | Status: DC

## 2014-07-12 NOTE — Progress Notes (Signed)
NURSING PROGRESS NOTE  Suzanne LentVera Rich [id number] Discharge Data: 07/12/2014 7:56 PM Attending Provider: Clydia LlanoMutaz Elmahi, MD PCP:No primary care provider on file.     Suzanne LentVera Rich to be D/C'd Skilled nursing facility per MD order.  Discussed with the patient the After Visit Summary and all questions fully answered. All IV's discontinued with no bleeding noted. All belongings returned to patient for patient to take home.   Transportation left patient information at the front desk. Attempted to catch up with them but was unsuccessful. Last Vital Signs:  Blood pressure 112/56, pulse 62, temperature 98.4 F (36.9 C), temperature source Oral, resp. rate 18, weight 66.951 kg (147 lb 9.6 oz), SpO2 100 %.  Discharge Medication List   Medication List    TAKE these medications        ADVAIR DISKUS 100-50 MCG/DOSE Aepb  Generic drug:  Fluticasone-Salmeterol  Inhale 1 puff into the lungs 2 (two) times daily.     aspirin 81 MG tablet  Take 81 mg by mouth daily.     calcium-vitamin D 500-200 MG-UNIT per tablet  Commonly known as:  OSCAL WITH D  Take 1 tablet by mouth 3 (three) times daily.     cephALEXin 500 MG capsule  Commonly known as:  KEFLEX  Take 1 capsule (500 mg total) by mouth 3 (three) times daily.     doxycycline 100 MG tablet  Commonly known as:  VIBRA-TABS  Take 1 tablet (100 mg total) by mouth 2 (two) times daily.     levothyroxine 75 MCG tablet  Commonly known as:  SYNTHROID, LEVOTHROID  Take 75 mcg by mouth daily before breakfast. For Hypothyroidism     lisinopril 10 MG tablet  Commonly known as:  PRINIVIL,ZESTRIL  Take 1 tablet (10 mg total) by mouth daily. For HTN     metoprolol succinate 100 MG 24 hr tablet  Commonly known as:  TOPROL-XL  Take 100 mg by mouth daily. Take with or immediately following a meal.     polyethylene glycol packet  Commonly known as:  MIRALAX  Take 17 g by mouth daily.     simvastatin 10 MG tablet  Commonly known as:  ZOCOR  Take 10 mg  by mouth daily. For Hyperlipidemia     spironolactone 25 MG tablet  Commonly known as:  ALDACTONE  Take 25 mg by mouth daily.     Vitamin D3 1000 UNITS Caps  Take 2,000 Units by mouth daily. 2 by mouth daily

## 2014-07-12 NOTE — Progress Notes (Signed)
Dr. Arthor CaptainElmahi notified of patient's soft BP's. Stated it was okay to hold toprol XL for today. Will monitor.  Leanna BattlesEckelmann, Siler Mavis Eileen, RN.

## 2014-07-12 NOTE — Clinical Social Work Note (Signed)
Clinical Social Work Assessment  Patient Details  Name: Suzanne Rich MRN: [id number] Date of Birth: 13-Nov-1940  Date of referral:  07/10/14               Reason for consult:  Facility Placement                Permission sought to share information with:  Facility Medical sales representativeContact Representative, Family Supports Permission granted to share information::  Yes, Verbal Permission Granted  Name::     Daughter, Suzanne Rich  Agency::     Relationship::     Contact Information:     Housing/Transportation Living arrangements for the past 2 months:  Skilled Building surveyorursing Facility Source of Information:  Patient Patient Interpreter Needed:  None Criminal Activity/Legal Involvement Pertinent to Current Situation/Hospitalization:  No - Comment as needed Significant Relationships:  Adult Children Lives with:    Do you feel safe going back to the place where you live?  Yes Need for family participation in patient care:  Yes (Comment) (Daighter [name] assists with decision making)  Care giving concerns:  None noted. Patient is from a skilled nursing facility   Social Worker assessment / plan:  CSW talked with patient who was awake, alert, very pleasant and lying in bed. Her son [name] and daughter-in-law were at the bedside. Patient advised that she is ready for discharge and [facility].    Employment status:  Retired Database administratornsurance information:  [place], [place] Recommend[place]:  Skilled Nursing Facility Information / Referral to community resources:  Other (Comment Required) (None requested or needed at this time)  Patient/Family's Response to care:  Care during this hospitalization not discussed as patient had visitors in the room.  Patient/Family's Understanding of and Emotional Response to Diagnosis, Current Treatment, and Prognosis:  Not discussed.  Emotional Assessment Appearance:  Appears stated age Attitude/Demeanor/Rapport:  Other (Attitude  appropriate for interaction) Affect (typically observed):  Appropriate Orientation:  Oriented to Self, Oriented to Place, Oriented to Situation, Oriented to  Time Alcohol / Substance use:  Tobacco Use (Patient reports that she has been smoking. She does not drink or use illicit drugs.) Psych involvement (Current and /or in the community):  No (Comment)  Discharge Needs  Concerns to be addressed:  No discharge needs identified ([facility] Readmission within the last 30 days:  No Current discharge risk:  None Barriers to Discharge:  No Barriers Identified   Cristobal GoldmannCrawford, Londen Lorge Bradley, LCSW 07/12/2014, 2:38 PM

## 2014-07-12 NOTE — Progress Notes (Signed)
Attempted called report to receiving RN at [facility] three different times. Two attempts to the main number [phone], was connected to receiving RN by the operator and the third attempt was this cell# [phone] provided by the operator. Each attempt was unsuccessful and kept ringing without a response.

## 2014-07-12 NOTE — Discharge Summary (Signed)
Physician Discharge Summary  Suzanne Rich ZOX:[id number] DOB: 1940-10-14 DOA: 07/09/2014  PCP: No primary care provider on file.  Admit date: 07/09/2014 Discharge date: 07/12/2014  Time spent: 40 minutes  Recommendations for Outpatient Follow-up:  1. Please bring patient back to [facility] on Monday morning, patient should be n.p.o past midnight Monday AM. 2. Patient will have surgery on Monday, 07/15/2014 by Dr. Darrick PennaFields, left AKA. 3. Lisinopril decreased to 10 mg.  Discharge Diagnoses:  Active Problems:   Essential hypertension, benign   COPD (chronic obstructive pulmonary disease)   Gangrene of toe   Cellulitis of right foot   Hyponatremia   Hypertension   Discharge Condition: Stable  Diet recommendation: Heart Healthy  Filed Weights   07/10/14 0307 07/10/14 2205 07/11/14 2024  Weight: 98.1 kg (216 lb 4.3 oz) 98.2 kg (216 lb 7.9 oz) 66.951 kg (147 lb 9.6 oz)    History of present illness:  Suzanne LentVera Buntrock is a 74 y.o. female with history of stroke with left-sided hemiparesis, ongoing tobacco abuse and COPD, hypertension and hyperlipidemia was referred to the ER after patient was found to have gangrenous toes. Patient states that she has been having some issues with her toes over the last few weeks but over the last 1 week patient started having increasing pain and swelling in the right foot and toes. Patient's PCP found that patient was having gangrenous great toes with swelling and was referred to the ER. On exam patient has gangrenous toe that right foot involving the greater toe and last 2 toes. Patient's dorsalis pedis artery and posterior tibial artery are dopplerable. Patient denies any fever or chills. On exam patient's foot is erythematous. Patient also has edema over the both lower extremities. Labs reveal hyponatremia. Patient denies any chest pain or shortness of breath.  Hospital Course:   Gangrenous toes Dry gangrene involving the right great, fourth and fifth  toes. Seen by Dr. Darrick PennaFields of vascular surgery, he was discussed with the daughter about amputation. Patient is scheduled for right above-knee amputation scheduled on Monday, 07/15/2014. She'll be discharged to her nursing home and be brought back on Monday morning for surgery.  Hyponatremia Acute on chronic hyponatremia, according to nursing home records run sodium of 130-132. Patient does have history of CHF, her urine sodium is elevated and plasma osmole is low. Presented with a sodium of 121, improved with treatment with Lasix 128 day of discharge. Follow-up on sodium level on on the surgery day, depending on the level she might need more evaluation.  History of stroke with left-sided hemiparesis No acute issues, continue aspirin. Patient is bedridden since her stroke.  History of CHF  LVEF is unknown, patient was on Lasix and spironolactone as outpatient, held recently. Patient looked euvolemic without clinical symptoms of decompensation.  Abdominal mass By exam there is lower abdominal palpable mass. CT scan was done and showed calcified uterine fibroiod, per CT scan no evidence of malignancy.  Constipation CT scan showed severe constipation with a big bowl of stools sits in her rectum, this resolved after the smog enema. Discharge on MiraLAX.  Procedures:  None  Consultations:  Vascular Surgery, Dr. Darrick PennaFields  Discharge Exam: Filed Vitals:   07/12/14 0934  BP: 102/50  Pulse: 58  Temp: 98.2 F (36.8 C)  Resp: 18   General: Alert and awake, oriented x3, not in any acute distress. HEENT: anicteric sclera, pupils reactive to light and accommodation, EOMI CVS: S1-S2 clear, no murmur rubs or gallops Chest: clear to auscultation  bilaterally, no wheezing, rales or rhonchi Abdomen: soft nontender, nondistended, normal bowel sounds, no organomegaly Extremities: Right lower extremity has necrotic and gangrenous right first, fourth and fifth toes. Neuro: Cranial nerves II-XII  intact, no focal neurological deficits  Discharge Instructions   Discharge Instructions    Diet - low sodium heart healthy    Complete by:  As directed      Increase activity slowly    Complete by:  As directed           Current Discharge Medication List    START taking these medications   Details  cephALEXin (KEFLEX) 500 MG capsule Take 1 capsule (500 mg total) by mouth 3 (three) times daily.    doxycycline (VIBRA-TABS) 100 MG tablet Take 1 tablet (100 mg total) by mouth 2 (two) times daily.    polyethylene glycol (MIRALAX) packet Take 17 g by mouth daily.      CONTINUE these medications which have CHANGED   Details  lisinopril (PRINIVIL,ZESTRIL) 10 MG tablet Take 1 tablet (10 mg total) by mouth daily. For HTN      CONTINUE these medications which have NOT CHANGED   Details  aspirin 81 MG tablet Take 81 mg by mouth daily.    calcium-vitamin D (OSCAL WITH D) 500-200 MG-UNIT per tablet Take 1 tablet by mouth 3 (three) times daily.    Cholecalciferol (VITAMIN D3) 1000 UNITS CAPS Take 2,000 Units by mouth daily. 2 by mouth daily    Fluticasone-Salmeterol (ADVAIR DISKUS) 100-50 MCG/DOSE AEPB Inhale 1 puff into the lungs 2 (two) times daily.    levothyroxine (SYNTHROID, LEVOTHROID) 75 MCG tablet Take 75 mcg by mouth daily before breakfast. For Hypothyroidism    metoprolol succinate (TOPROL-XL) 100 MG 24 hr tablet Take 100 mg by mouth daily. Take with or immediately following a meal.    simvastatin (ZOCOR) 10 MG tablet Take 10 mg by mouth daily. For Hyperlipidemia    spironolactone (ALDACTONE) 25 MG tablet Take 25 mg by mouth daily.       No Known Allergies    The results of significant diagnostics from this hospitalization (including imaging, microbiology, ancillary and laboratory) are listed below for reference.    Significant Diagnostic Studies: Ct Abdomen Pelvis W Contrast  07/10/2014   CLINICAL DATA:  Questionable large lower abdominal mass on exam.  EXAM: CT  ABDOMEN AND PELVIS WITH CONTRAST  TECHNIQUE: Multidetector CT imaging of the abdomen and pelvis was performed using the standard protocol following bolus administration of intravenous contrast.  CONTRAST:  OMNIPAQUE IOHEXOL 300 MG/ML  SOLN  COMPARISON:  None.  FINDINGS: There is a large partly calcified heterogeneous mass arising from the pelvis into the lower to central abdomen. Mass measures 20 cm by 15 cm x 10 cm. Mass extends from the level of the upper vagina indenting the superior bladder. No normal uterus is seen. This is presumed to be the uterus enlarged by numerous fibroids.  Neither ovary is discretely seen. No other abdominal or pelvic masses.  There is moderate to marked distention of the rectum with stool. Mild to moderate increased stool is seen throughout the colon. There is no bowel distention to suggest obstruction. There is no bowel wall thickening or mesenteric inflammation.  Lung bases show minor subsegmental atelectasis. Heart is normal in size.  Liver, spleen, gallbladder, pancreas and adrenal glands: Unremarkable.  Probable small cyst from the anterior upper pole of the left kidney. No other renal abnormality. Normal ureters. Bladder is unremarkable.  Mildly prominent  inguinal lymph nodes, largest actually along the inferior external iliac chain measuring 12 mm in short axis. These are most likely all reactive.  No ascites.  Dense atherosclerotic calcifications noted along a normal caliber abdominal aorta and its branch vessels.  Degenerative changes noted of the lower lumbar spine. No osteoblastic or osteolytic lesions.  IMPRESSION: 1. 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 2. No acute findings within the abdomen pelvis. No findings suspicious for malignancy. 3. There is significant distention of the rectum with stool with mild to moderate increased stool throughout the colon.   Electronically Signed   By: Amie Portland M.D.   On:  07/10/2014 15:27   Dg Chest Port 1 View  07/10/2014   CLINICAL DATA:  Hyponatremia. History of hypertension, stroke, COPD, CHF. Current smoker.  EXAM: PORTABLE CHEST - 1 VIEW  COMPARISON:  None.  FINDINGS: Normal heart size and pulmonary vascularity. No focal airspace disease or consolidation in the lungs. No blunting of costophrenic angles. No pneumothorax. Mediastinal contours appear intact. Calcified and tortuous aorta. Degenerative changes in the spine and shoulders. Mild thoracic scoliosis convex towards the right.  IMPRESSION: No active disease.   Electronically Signed   By: Burman Nieves M.D.   On: 07/10/2014 00:22   Dg Foot Complete Right  07/09/2014   CLINICAL DATA:  Right foot pain. Black areas of necrosis in the first and fourth toes. History of gout, osteoporosis, and edema. Swelling.  EXAM: RIGHT FOOT COMPLETE - 3+ VIEW  COMPARISON:  None.  FINDINGS: Diffuse bone demineralization. Mild soft tissue swelling over the forefoot. Degenerative changes in the interphalangeal joints and first metacarpal phalangeal joint. Small plantar calcaneal spur. Suggestion of mild ostial ice this at the first distal phalangeal tuft. No discrete cortical loss. No evidence of acute fracture or dislocation. 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  IMPRESSION: Degenerative changes in the right foot with diffuse demineralization. Soft tissue swelling. No acute bony abnormalities.   Electronically Signed   By: Burman Nieves M.D.   On: 07/09/2014 21:57    Microbiology: No results found for this or any previous visit (from the past 240 hour(s)).   Labs: Basic Metabolic Panel:  Recent Labs Lab 07/10/14 0434 07/10/14 0655 07/10/14 1031 07/11/14 0545 07/12/14 0925  NA 126* 127* 127* 127* 128*  K 4.1 4.0 4.3 4.2 4.3  CL 95* 94* 94* 98* 99*  CO2 20* GLUCOSE 119* 98 111* 94 123*  BUN CREATININE 0.79 0.81 0.73 0.72 0.73  CALCIUM 8.9 9.0 9.0 8.7* 8.6*   Liver  Function Tests:  Recent Labs Lab 07/10/14 0434  AST 24  ALT 18  ALKPHOS 77  BILITOT 0.7  PROT 6.2*  ALBUMIN 3.1*   No results for input(s): LIPASE, AMYLASE in the last 168 hours. No results for input(s): AMMONIA in the last 168 hours. CBC:  Recent Labs Lab 07/09/14 2130 07/10/14 0434 07/10/14 0655  WBC 11.2* 9.4 9.8  NEUTROABS 8.0*  --   --   HGB 12.7 11.8* 12.5  HCT 36.4 33.3* 35.8*  MCV 88.1 86.5 86.5  PLT 398 390 401*   Cardiac Enzymes: No results for input(s): CKTOTAL, CKMB, CKMBINDEX, TROPONINI in the last 168 hours. BNP: BNP (last 3 results) No results for input(s): BNP in the last 8760 hours.  ProBNP (last 3 results) No results for input(s): PROBNP in the last  8760 hours.  CBG: No results for input(s): GLUCAP in the last 168 hours.     Signed:  Coleton Woon A  [facility] 07/12/2014, 2:28 PM

## 2014-07-12 NOTE — Care Management Note (Signed)
Case Management Note  Patient Details  Name: Suzanne Rich MRN: [id number] Date of Birth: 18-Sep-1940  Subjective/Objective:       CM following for progression and d/c planning.            Action/Plan: Pt to d/c back to SNF, she will return for surgery on Monday.   Expected Discharge Date:         07/12/2014         Expected Discharge Plan:  Skilled Nursing Facility  In-House Referral:  Clinical Social Work  Discharge planning Services  CM Consult  Post Acute Care Choice:    Choice offered to:     DME Arranged:    DME Agency:     HH Arranged:    HH Agency:     Status of Service:  Completed, signed off  Medicare Important Message Given:  Yes Date Medicare IM Given:  07/12/14 Medicare IM give by:  Johny Shockheryl Mickey Esguerra RN MPH, case manager, [phone] Date Additional Medicare IM Given:    Additional Medicare Important Message give by:     If discussed at Long Length of Stay Meetings, dates discussed:    Additional Comments:  Starlyn SkeansRoyal, Suzanne Badalamenti U, RN 07/12/2014, 3:02 PM

## 2014-07-12 NOTE — Clinical Social Work Note (Addendum)
Ms. Suzanne Rich is medically stable to discharge back [facility] today. Facility alerted and d/c summary transmitted to them. Patient will be transported by ambulance ([facility]) back to facility.  Daughter [name] [phone]) contacted and advised of d/c and ambulance transport. Also talked with daughter about Monday's surgery.    Genelle BalVanessa Shriyan Arakawa, MSW, LCSW Licensed Clinical Social Worker Clinical Social Work Department [facility] [phone]

## 2014-07-15 ENCOUNTER — Inpatient Hospital Stay (HOSPITAL_COMMUNITY): Payer: Medicare Other | Admitting: Anesthesiology

## 2014-07-15 ENCOUNTER — Encounter (HOSPITAL_COMMUNITY): Payer: Self-pay | Admitting: Surgery

## 2014-07-15 ENCOUNTER — Inpatient Hospital Stay (HOSPITAL_COMMUNITY)
Admission: AD | Admit: 2014-07-15 | Discharge: 2014-07-17 | DRG: 240 | Disposition: A | Discharge status: Skilled Nursing Facility | Payer: Medicare Other | Source: Ambulatory Visit | Attending: Vascular Surgery | Admitting: Vascular Surgery

## 2014-07-15 ENCOUNTER — Encounter (HOSPITAL_COMMUNITY)
Admission: AD | Disposition: A | Discharge status: Skilled Nursing Facility | Payer: Self-pay | Source: Ambulatory Visit | Attending: Vascular Surgery

## 2014-07-15 DIAGNOSIS — E871 Hypo-osmolality and hyponatremia: Secondary | ICD-10-CM | POA: Diagnosis present

## 2014-07-15 DIAGNOSIS — Z79899 Other long term (current) drug therapy: Secondary | ICD-10-CM

## 2014-07-15 DIAGNOSIS — E785 Hyperlipidemia, unspecified: Secondary | ICD-10-CM | POA: Diagnosis present

## 2014-07-15 DIAGNOSIS — I69354 Hemiplegia and hemiparesis following cerebral infarction affecting left non-dominant side: Secondary | ICD-10-CM | POA: Diagnosis not present

## 2014-07-15 DIAGNOSIS — E039 Hypothyroidism, unspecified: Secondary | ICD-10-CM | POA: Diagnosis present

## 2014-07-15 DIAGNOSIS — I509 Heart failure, unspecified: Secondary | ICD-10-CM | POA: Diagnosis present

## 2014-07-15 DIAGNOSIS — R1900 Intra-abdominal and pelvic swelling, mass and lump, unspecified site: Secondary | ICD-10-CM | POA: Diagnosis present

## 2014-07-15 DIAGNOSIS — F17210 Nicotine dependence, cigarettes, uncomplicated: Secondary | ICD-10-CM | POA: Diagnosis present

## 2014-07-15 DIAGNOSIS — E46 Unspecified protein-calorie malnutrition: Secondary | ICD-10-CM | POA: Diagnosis present

## 2014-07-15 DIAGNOSIS — Z7982 Long term (current) use of aspirin: Secondary | ICD-10-CM | POA: Diagnosis not present

## 2014-07-15 DIAGNOSIS — I96 Gangrene, not elsewhere classified: Principal | ICD-10-CM | POA: Diagnosis present

## 2014-07-15 DIAGNOSIS — J449 Chronic obstructive pulmonary disease, unspecified: Secondary | ICD-10-CM | POA: Diagnosis present

## 2014-07-15 DIAGNOSIS — Z90710 Acquired absence of both cervix and uterus: Secondary | ICD-10-CM | POA: Diagnosis not present

## 2014-07-15 DIAGNOSIS — I739 Peripheral vascular disease, unspecified: Secondary | ICD-10-CM | POA: Diagnosis present

## 2014-07-15 DIAGNOSIS — M810 Age-related osteoporosis without current pathological fracture: Secondary | ICD-10-CM | POA: Diagnosis present

## 2014-07-15 DIAGNOSIS — F0390 Unspecified dementia without behavioral disturbance: Secondary | ICD-10-CM | POA: Diagnosis present

## 2014-07-15 DIAGNOSIS — Z6825 Body mass index (BMI) 25.0-25.9, adult: Secondary | ICD-10-CM | POA: Diagnosis not present

## 2014-07-15 DIAGNOSIS — I10 Essential (primary) hypertension: Secondary | ICD-10-CM | POA: Diagnosis present

## 2014-07-15 DIAGNOSIS — I70261 Atherosclerosis of native arteries of extremities with gangrene, right leg: Secondary | ICD-10-CM

## 2014-07-15 DIAGNOSIS — M109 Gout, unspecified: Secondary | ICD-10-CM | POA: Diagnosis present

## 2014-07-15 HISTORY — PX: AMPUTATION: SHX166

## 2014-07-15 LAB — CBC
HEMATOCRIT: 37.9 % (ref 36.0–46.0)
Hemoglobin: 13.1 g/dL (ref 12.0–15.0)
MCH: 30.7 pg (ref 26.0–34.0)
MCHC: 34.6 g/dL (ref 30.0–36.0)
MCV: 88.8 fL (ref 78.0–100.0)
Platelets: 397 10*3/uL (ref 150–400)
RBC: 4.27 MIL/uL (ref 3.87–5.11)
RDW: 14.2 % (ref 11.5–15.5)
WBC: 9.4 10*3/uL (ref 4.0–10.5)

## 2014-07-15 LAB — BASIC METABOLIC PANEL
Anion gap: 9 (ref 5–15)
BUN: 6 mg/dL (ref 6–20)
CHLORIDE: 97 mmol/L — AB (ref 101–111)
CO2: 24 mmol/L (ref 22–32)
CREATININE: 0.78 mg/dL (ref 0.44–1.00)
Calcium: 9.6 mg/dL (ref 8.9–10.3)
GFR calc Af Amer: >60 mL/min (ref >60)
GFR calc non Af Amer: >60 mL/min (ref >60)
Glucose, Bld: 125 mg/dL — ABNORMAL HIGH (ref 65–99)
POTASSIUM: 4.4 mmol/L (ref 3.5–5.1)
Sodium: 130 mmol/L — ABNORMAL LOW (ref 135–145)

## 2014-07-15 LAB — SURGICAL PCR SCREEN
MRSA, PCR: NEGATIVE
Staphylococcus aureus: NEGATIVE

## 2014-07-15 SURGERY — AMPUTATION, ABOVE KNEE
Anesthesia: General | Site: Leg Upper | Laterality: Right

## 2014-07-15 MED ORDER — PROPOFOL 10 MG/ML IV BOLUS
INTRAVENOUS | Status: DC | PRN
  Administered 2014-07-15: 110 mg via INTRAVENOUS

## 2014-07-15 MED ORDER — FENTANYL CITRATE (PF) 250 MCG/5ML IJ SOLN
INTRAMUSCULAR | Status: AC
  Filled 2014-07-15: qty 5

## 2014-07-15 MED ORDER — 0.9 % SODIUM CHLORIDE (POUR BTL) OPTIME
TOPICAL | Status: DC | PRN
  Administered 2014-07-15: 1000 mL

## 2014-07-15 MED ORDER — POTASSIUM CHLORIDE CRYS ER 20 MEQ PO TBCR: 20.0000 meq | EXTENDED_RELEASE_TABLET | Freq: Every day | ORAL | Status: DC | PRN

## 2014-07-15 MED ORDER — CALCIUM CARBONATE-VITAMIN D 500-200 MG-UNIT PO TABS
1.0000 | ORAL_TABLET | Freq: Three times a day (TID) | ORAL | Status: DC
  Administered 2014-07-15 – 2014-07-17 (×6): 1 via ORAL
  Filled 2014-07-15 (×9): qty 1

## 2014-07-15 MED ORDER — PANTOPRAZOLE SODIUM 40 MG PO TBEC
40.0000 mg | DELAYED_RELEASE_TABLET | Freq: Every day | ORAL | Status: DC
  Administered 2014-07-15 – 2014-07-17 (×3): 40 mg via ORAL
  Filled 2014-07-15 (×3): qty 1

## 2014-07-15 MED ORDER — MUPIROCIN 2 % EX OINT
1.0000 "application " | TOPICAL_OINTMENT | Freq: Once | CUTANEOUS | Status: AC
  Administered 2014-07-15: 1 via TOPICAL

## 2014-07-15 MED ORDER — PHENOL 1.4 % MT LIQD
1.0000 | OROMUCOSAL | Status: DC | PRN
  Filled 2014-07-15: qty 177

## 2014-07-15 MED ORDER — METOPROLOL SUCCINATE ER 100 MG PO TB24
100.0000 mg | ORAL_TABLET | Freq: Every day | ORAL | Status: DC
  Administered 2014-07-16 – 2014-07-17 (×2): 100 mg via ORAL
  Filled 2014-07-15 (×2): qty 1

## 2014-07-15 MED ORDER — LABETALOL HCL 5 MG/ML IV SOLN
10.0000 mg | INTRAVENOUS | Status: DC | PRN
  Filled 2014-07-15: qty 4

## 2014-07-15 MED ORDER — CEFAZOLIN SODIUM-DEXTROSE 2-3 GM-% IV SOLR
INTRAVENOUS | Status: DC | PRN
Start: 2014-07-15 — End: 2014-07-15
  Administered 2014-07-15: 2 g via INTRAVENOUS

## 2014-07-15 MED ORDER — ONDANSETRON HCL 4 MG/2ML IJ SOLN: 4.0000 mg | Freq: Four times a day (QID) | INTRAMUSCULAR | Status: DC | PRN

## 2014-07-15 MED ORDER — DOCUSATE SODIUM 100 MG PO CAPS
100.0000 mg | ORAL_CAPSULE | Freq: Every day | ORAL | Status: DC
  Administered 2014-07-17: 100 mg via ORAL
  Filled 2014-07-15 (×2): qty 1

## 2014-07-15 MED ORDER — MORPHINE SULFATE 2 MG/ML IJ SOLN: 2.0000 mg | INTRAMUSCULAR | Status: DC | PRN

## 2014-07-15 MED ORDER — LIDOCAINE HCL (CARDIAC) 20 MG/ML IV SOLN
INTRAVENOUS | Status: DC | PRN
  Administered 2014-07-15: 70 mg via INTRAVENOUS

## 2014-07-15 MED ORDER — OXYCODONE-ACETAMINOPHEN 5-325 MG PO TABS
1.0000 | ORAL_TABLET | ORAL | Status: DC | PRN
  Administered 2014-07-15: 1 via ORAL
  Filled 2014-07-15: qty 1

## 2014-07-15 MED ORDER — OXYCODONE HCL 5 MG PO TABS: 5.0000 mg | ORAL_TABLET | Freq: Once | ORAL | Status: DC | PRN

## 2014-07-15 MED ORDER — GUAIFENESIN-DM 100-10 MG/5ML PO SYRP: 15.0000 mL | ORAL_SOLUTION | ORAL | Status: DC | PRN

## 2014-07-15 MED ORDER — MAGNESIUM SULFATE 2 GM/50ML IV SOLN
2.0000 g | Freq: Every day | INTRAVENOUS | Status: DC | PRN
  Filled 2014-07-15: qty 50

## 2014-07-15 MED ORDER — SODIUM CHLORIDE 0.9 % IV SOLN
INTRAVENOUS | Status: DC
  Administered 2014-07-15: 100 mL/h via INTRAVENOUS
  Administered 2014-07-16: 01:00:00 via INTRAVENOUS
  Administered 2014-07-16: 1 mL via INTRAVENOUS

## 2014-07-15 MED ORDER — ACETAMINOPHEN 325 MG PO TABS: 325.0000 mg | ORAL_TABLET | ORAL | Status: DC | PRN

## 2014-07-15 MED ORDER — OXYCODONE HCL 5 MG/5ML PO SOLN: 5.0000 mg | Freq: Once | ORAL | Status: DC | PRN

## 2014-07-15 MED ORDER — ONDANSETRON HCL 4 MG/2ML IJ SOLN
INTRAMUSCULAR | Status: DC | PRN
  Administered 2014-07-15: 4 mg via INTRAVENOUS

## 2014-07-15 MED ORDER — DEXTROSE 5 % IV SOLN
1.5000 g | Freq: Two times a day (BID) | INTRAVENOUS | Status: AC
  Administered 2014-07-15 – 2014-07-16 (×2): 1.5 g via INTRAVENOUS
  Filled 2014-07-15 (×2): qty 1.5

## 2014-07-15 MED ORDER — FENTANYL CITRATE (PF) 100 MCG/2ML IJ SOLN: 25.0000 ug | INTRAMUSCULAR | Status: DC | PRN

## 2014-07-15 MED ORDER — SIMVASTATIN 10 MG PO TABS
10.0000 mg | ORAL_TABLET | Freq: Every day | ORAL | Status: DC
  Administered 2014-07-15 – 2014-07-16 (×2): 10 mg via ORAL
  Filled 2014-07-15 (×3): qty 1

## 2014-07-15 MED ORDER — ASPIRIN EC 81 MG PO TBEC
81.0000 mg | DELAYED_RELEASE_TABLET | Freq: Every day | ORAL | Status: DC
  Administered 2014-07-16 – 2014-07-17 (×2): 81 mg via ORAL
  Filled 2014-07-15 (×2): qty 1

## 2014-07-15 MED ORDER — VITAMIN D3 25 MCG (1000 UT) PO CAPS: 2000.0000 [IU] | ORAL_CAPSULE | Freq: Every day | ORAL | Status: DC

## 2014-07-15 MED ORDER — MOMETASONE FURO-FORMOTEROL FUM 100-5 MCG/ACT IN AERO
2.0000 | INHALATION_SPRAY | Freq: Two times a day (BID) | RESPIRATORY_TRACT | Status: DC
  Administered 2014-07-15 – 2014-07-17 (×4): 2 via RESPIRATORY_TRACT
  Filled 2014-07-15: qty 8.8

## 2014-07-15 MED ORDER — LISINOPRIL 10 MG PO TABS
10.0000 mg | ORAL_TABLET | Freq: Every day | ORAL | Status: DC
  Administered 2014-07-16 – 2014-07-17 (×2): 10 mg via ORAL
  Filled 2014-07-15 (×2): qty 1

## 2014-07-15 MED ORDER — VITAMIN D3 25 MCG (1000 UNIT) PO TABS
2000.0000 [IU] | ORAL_TABLET | Freq: Every day | ORAL | Status: DC
Start: 2014-07-16 — End: 2014-07-17
  Administered 2014-07-16 – 2014-07-17 (×2): 2000 [IU] via ORAL
  Filled 2014-07-15 (×2): qty 2

## 2014-07-15 MED ORDER — ENOXAPARIN SODIUM 30 MG/0.3ML ~~LOC~~ SOLN
30.0000 mg | SUBCUTANEOUS | Status: DC
  Administered 2014-07-16 – 2014-07-17 (×2): 30 mg via SUBCUTANEOUS
  Filled 2014-07-15 (×3): qty 0.3

## 2014-07-15 MED ORDER — HYDRALAZINE HCL 20 MG/ML IJ SOLN
5.0000 mg | INTRAMUSCULAR | Status: DC | PRN
  Administered 2014-07-15: 5 mg via INTRAVENOUS
  Filled 2014-07-15: qty 1

## 2014-07-15 MED ORDER — METOPROLOL TARTRATE 1 MG/ML IV SOLN: 2.0000 mg | INTRAVENOUS | Status: DC | PRN

## 2014-07-15 MED ORDER — FENTANYL CITRATE (PF) 100 MCG/2ML IJ SOLN
INTRAMUSCULAR | Status: DC | PRN
  Administered 2014-07-15 (×2): 50 ug via INTRAVENOUS

## 2014-07-15 MED ORDER — ALUM & MAG HYDROXIDE-SIMETH 200-200-20 MG/5ML PO SUSP: 15.0000 mL | ORAL | Status: DC | PRN

## 2014-07-15 MED ORDER — POLYETHYLENE GLYCOL 3350 17 G PO PACK
17.0000 g | PACK | Freq: Every day | ORAL | Status: DC
  Administered 2014-07-17: 17 g via ORAL
  Filled 2014-07-15 (×3): qty 1

## 2014-07-15 MED ORDER — LACTATED RINGERS IV SOLN
INTRAVENOUS | Status: DC
  Administered 2014-07-15 (×2): via INTRAVENOUS

## 2014-07-15 MED ORDER — ACETAMINOPHEN 650 MG RE SUPP: 325.0000 mg | RECTAL | Status: DC | PRN

## 2014-07-15 MED ORDER — MUPIROCIN 2 % EX OINT
TOPICAL_OINTMENT | CUTANEOUS | Status: AC
Start: 2014-07-15 — End: 2014-07-15
  Administered 2014-07-15: 1 via TOPICAL
  Filled 2014-07-15: qty 22

## 2014-07-15 MED ORDER — LEVOTHYROXINE SODIUM 75 MCG PO TABS
75.0000 ug | ORAL_TABLET | Freq: Every day | ORAL | Status: DC
  Administered 2014-07-16 – 2014-07-17 (×2): 75 ug via ORAL
  Filled 2014-07-15 (×4): qty 1

## 2014-07-15 MED ORDER — SPIRONOLACTONE 25 MG PO TABS
25.0000 mg | ORAL_TABLET | Freq: Every day | ORAL | Status: DC
  Administered 2014-07-16 – 2014-07-17 (×2): 25 mg via ORAL
  Filled 2014-07-15 (×2): qty 1

## 2014-07-15 MED ORDER — MIDAZOLAM HCL 2 MG/2ML IJ SOLN
INTRAMUSCULAR | Status: AC
  Filled 2014-07-15: qty 2

## 2014-07-15 SURGICAL SUPPLY — 45 items
BANDAGE ELASTIC 6 VELCRO ST LF (GAUZE/BANDAGES/DRESSINGS) ×4 IMPLANT
BLADE SAW RECIP 87.9 MT (BLADE) ×2 IMPLANT
BNDG COHESIVE 4X5 TAN STRL (GAUZE/BANDAGES/DRESSINGS) ×2 IMPLANT
BNDG COHESIVE 6X5 TAN STRL LF (GAUZE/BANDAGES/DRESSINGS) ×2 IMPLANT
BNDG GAUZE ELAST 4 BULKY (GAUZE/BANDAGES/DRESSINGS) ×2 IMPLANT
CANISTER SUCTION 2500CC (MISCELLANEOUS) ×2 IMPLANT
CLIP TI MEDIUM 6 (CLIP) IMPLANT
COVER TABLE BACK 60X90 (DRAPES) ×2 IMPLANT
DRAIN CHANNEL 19F RND (DRAIN) IMPLANT
DRAPE ORTHO SPLIT 77X108 STRL (DRAPES) ×4
DRAPE PROXIMA HALF (DRAPES) ×2 IMPLANT
DRAPE SURG ORHT 6 SPLT 77X108 (DRAPES) ×2 IMPLANT
DRSG ADAPTIC 3X8 NADH LF (GAUZE/BANDAGES/DRESSINGS) ×2 IMPLANT
ELECT REM PT RETURN 9FT ADLT (ELECTROSURGICAL) ×2
ELECTRODE REM PT RTRN 9FT ADLT (ELECTROSURGICAL) ×1 IMPLANT
EVACUATOR SILICONE 100CC (DRAIN) IMPLANT
GAUZE SPONGE 4X4 12PLY STRL (GAUZE/BANDAGES/DRESSINGS) ×2 IMPLANT
GLOVE BIO SURGEON STRL SZ7.5 (GLOVE) ×2 IMPLANT
GLOVE BIOGEL PI IND STRL 6.5 (GLOVE) ×1 IMPLANT
GLOVE BIOGEL PI IND STRL 7.0 (GLOVE) ×1 IMPLANT
GLOVE BIOGEL PI IND STRL 7.5 (GLOVE) ×1 IMPLANT
GLOVE BIOGEL PI INDICATOR 6.5 (GLOVE) ×1
GLOVE BIOGEL PI INDICATOR 7.0 (GLOVE) ×1
GLOVE BIOGEL PI INDICATOR 7.5 (GLOVE) ×1
GLOVE ECLIPSE 6.5 STRL STRAW (GLOVE) ×2 IMPLANT
GLOVE ECLIPSE 7.0 STRL STRAW (GLOVE) ×2 IMPLANT
GOWN STRL REUS W/ TWL LRG LVL3 (GOWN DISPOSABLE) ×3 IMPLANT
GOWN STRL REUS W/TWL LRG LVL3 (GOWN DISPOSABLE) ×6
KIT BASIN OR (CUSTOM PROCEDURE TRAY) ×2 IMPLANT
KIT ROOM TURNOVER OR (KITS) ×2 IMPLANT
NS IRRIG 1000ML POUR BTL (IV SOLUTION) ×2 IMPLANT
PACK GENERAL/GYN (CUSTOM PROCEDURE TRAY) ×2 IMPLANT
PAD ARMBOARD 7.5X6 YLW CONV (MISCELLANEOUS) ×4 IMPLANT
SPONGE GAUZE 4X4 12PLY STER LF (GAUZE/BANDAGES/DRESSINGS) ×2 IMPLANT
STAPLER VISISTAT 35W (STAPLE) ×2 IMPLANT
STOCKINETTE IMPERVIOUS LG (DRAPES) ×2 IMPLANT
SUT ETHILON 3 0 PS 1 (SUTURE) IMPLANT
SUT SILK 2 0 SH (SUTURE) ×2 IMPLANT
SUT SILK 2 0 TIES 10X30 (SUTURE) ×2 IMPLANT
SUT VIC AB 2-0 CT1 18 (SUTURE) ×4 IMPLANT
SUT VIC AB 2-0 SH 18 (SUTURE) ×4 IMPLANT
SUT VIC AB 3-0 SH 27 (SUTURE) ×2
SUT VIC AB 3-0 SH 27X BRD (SUTURE) ×2 IMPLANT
UNDERPAD 30X30 INCONTINENT (UNDERPADS AND DIAPERS) ×2 IMPLANT
WATER STERILE IRR 1000ML POUR (IV SOLUTION) ×2 IMPLANT

## 2014-07-15 NOTE — Anesthesia Postprocedure Evaluation (Signed)
Anesthesia Post Note  Patient: Suzanne Rich  Procedure(s) Performed: Procedure(s) (LRB): AMPUTATION ABOVE KNEE-RIGHT (Right)  Anesthesia type: General  Patient location: PACU  Post pain: Pain level controlled and Adequate analgesia  Post assessment: Post-op Vital signs reviewed, Patient's Cardiovascular Status Stable, Respiratory Function Stable, Patent Airway and Pain level controlled  Last Vitals:  Filed Vitals:   07/15/14 1430  BP:   Pulse: 51  Temp:   Resp: 19    Post vital signs: Reviewed and stable  Level of consciousness: awake, alert  and oriented  Complications: No apparent anesthesia complications

## 2014-07-15 NOTE — Transfer of Care (Signed)
Immediate Anesthesia Transfer of Care Note  Patient: Suzanne LentVera Bochenek  Procedure(s) Performed: Procedure(s): AMPUTATION ABOVE KNEE-RIGHT (Right)  Patient Location: PACU  Anesthesia Type:General  Level of Consciousness: oriented, sedated, patient cooperative and responds to stimulation  Airway & Oxygen Therapy: Patient Spontanous Breathing and Patient connected to nasal cannula oxygen  Post-op Assessment: Report given to RN, Post -op Vital signs reviewed and stable, Patient moving all extremities and Patient moving all extremities X 4  Post vital signs: Reviewed and stable  Last Vitals:  Filed Vitals:   07/15/14 1006  BP: 145/68  Pulse: 95  Temp: 36.4 C  Resp: 16    Complications: No apparent anesthesia complications

## 2014-07-15 NOTE — Anesthesia Preprocedure Evaluation (Signed)
Anesthesia Evaluation  Patient identified by MRN, date of birth, ID band Patient awake    Reviewed: Allergy & Precautions, NPO status , Patient's Chart, lab work & pertinent test results  Airway Mallampati: II   Neck ROM: full    Dental   Pulmonary COPDCurrent Smoker,  breath sounds clear to auscultation        Cardiovascular hypertension, +CHF Rhythm:regular Rate:Normal     Neuro/Psych dementiaCVA    GI/Hepatic   Endo/Other  Hypothyroidism   Renal/GU      Musculoskeletal   Abdominal   Peds  Hematology   Anesthesia Other Findings   Reproductive/Obstetrics                             Anesthesia Physical Anesthesia Plan  ASA: III  Anesthesia Plan: General   Post-op Pain Management:    Induction: Intravenous  Airway Management Planned: LMA  Additional Equipment:   Intra-op Plan:   Post-operative Plan:   Informed Consent: I have reviewed the patients History and Physical, chart, labs and discussed the procedure including the risks, benefits and alternatives for the proposed anesthesia with the patient or authorized representative who has indicated his/her understanding and acceptance.     Plan Discussed with: CRNA, Anesthesiologist and Surgeon  Anesthesia Plan Comments:         Anesthesia Quick Evaluation

## 2014-07-15 NOTE — Op Note (Signed)
[facility] OPERATIVE NOTE   Procedure: Right above knee amputation  Surgeon(s): Sherren Kernsharles E Nandika Stetzer, MD  ASSISTANT: Lianne CureMaureen Collins, PA-C  Anesthesia: General  Specimens: Right leg  PROCEDURE DETAIL: After obtaining informed consent, the patient was taken to the operating room. The patient was placed in supine position the operating room table. After induction of general anesthesia and endotracheal intubation the patient's Foley catheter was placed. Next patient's entire right lower extremity was prepped and draped in usual sterile fashion. A circumferential incision was made on the right leg just above the knee. The incision was carried down into the sucutaneous tissues down to level the saphenous vein. This was small and divided with cautery. Soft tissues were taken down as well as the muscle and fascia with cautery. The superficial femoral artery and vein were dissected free circumferentially dissected free clamped and divided. These were suture ligated proximally. Remainder of the soft tissues were taken down with cautery. The periosteum was raised on the femur approximately 5 cm above the skin edge. The femur was divided at this level. The leg was passed off the table as a specimen. Hemostasis was obtained. The wound was thoroughly irrigated with normal saline solution. The fascial edges were reapproximated using interrupted 2 0 Vicryl sutures. The subcutaneous tissues reapproximated using a running 3-0 Vicryl suture. The skin was closed staples. Patient tolerated procedure well and there were no complications. Instrument sponge and needle counts correct in the case. Patient was taken to recovery in stable condition.  Fabienne Brunsharles Aadyn Buchheit, MD [facility] Office: [phone] Pager: [phone]

## 2014-07-15 NOTE — Interval H&P Note (Signed)
History and Physical Interval Note:  07/15/2014 12:05 PM  Suzanne LentVera Deren  has presented today for surgery, with the diagnosis of Dry Gangrene Right First Toe  I96  The various methods of treatment have been discussed with the patient and family. After consideration of risks, benefits and other options for treatment, the patient has consented to  Procedure(s): AMPUTATION ABOVE KNEE-RIGHT (Right) as a surgical intervention .  The patient's history has been reviewed, patient examined, no change in status, stable for surgery.  I have reviewed the patient's chart and labs.  Questions were answered to the patient's satisfaction.     Fabienne BrunsFields, Charles

## 2014-07-15 NOTE — Anesthesia Procedure Notes (Signed)
Procedure Name: LMA Insertion Date/Time: 07/15/2014 12:33 PM Performed by: Wray KearnsFOLEY, Jaleil Renwick A Pre-anesthesia Checklist: Patient identified, Timeout performed, Emergency Drugs available, Suction available and Patient being monitored Patient Re-evaluated:Patient Re-evaluated prior to inductionOxygen Delivery Method: Circle system utilized Preoxygenation: Pre-oxygenation with 100% oxygen Intubation Type: IV induction Ventilation: Mask ventilation without difficulty LMA: LMA inserted LMA Size: 4.0 Tube type: Oral Number of attempts: 1 Placement Confirmation: ETT inserted through vocal cords under direct vision,  breath sounds checked- equal and bilateral and positive ETCO2 Tube secured with: Tape Dental Injury: Teeth and Oropharynx as per pre-operative assessment

## 2014-07-15 NOTE — H&P (View-Only) (Signed)
[facility] OF [place] HISTORY AND PHYSICAL   History of Present Illness:  Patient is a 74 y.o. year old female who presents for evaluation of gangrene right first toe with pain.  The patient has some dementia and does not know how the problem in her toe started.  She complains of intermittent pain but also has chronic numbness in the toe.  No fever or chills.  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  Her daughter is her POA and was present for the interview.  Other medical problems include hypertension, prior CVA, COPD, hyperlipidemia, CHF, ongoing tobacco abuse.  She currently resides at [facility] and has for several years.  Past Medical History  Diagnosis Date  . Hypertension   . Stroke   . COPD (chronic obstructive pulmonary disease)   . Hypothyroidism   . Hyperlipidemia   . Edema   . CHF (congestive heart failure)   . Gout   . OP (osteoporosis)     Past Surgical History  Procedure Laterality Date  . Abdominal hysterectomy      Social History History  Substance Use Topics  . Smoking status: Current Every Day Smoker  . Smokeless tobacco: Not on file  . Alcohol Use: No    Family History Family History  Problem Relation Age of Onset  . Colon cancer Mother   . Ovarian cancer Mother   . Emphysema Sister   . Heart failure Daughter     Allergies  No Known Allergies   Current Facility-Administered Medications  Medication Dose Route Frequency Provider Last Rate Last Dose  . acetaminophen (TYLENOL) tablet 650 mg  650 mg Oral Q6H PRN Eduard ClosArshad N Kakrakandy, MD       Or  . acetaminophen (TYLENOL) suppository 650 mg  650 mg Rectal Q6H PRN Eduard ClosArshad N Kakrakandy, MD      . aspirin chewable tablet 81 mg  81 mg Oral Daily Eduard ClosArshad N Kakrakandy, MD      . ceFEPIme (MAXIPIME) 2 g in dextrose 5 % 50 mL IVPB  2 g Intravenous Q12H Veronda P Bryk, RPH   2 g at 07/10/14 0424  . heparin injection 5,000 Units  5,000 Units  Subcutaneous 3 times per day Eduard ClosArshad N Kakrakandy, MD   5,000 Units at 07/10/14 0606  . hydrALAZINE (APRESOLINE) injection 10 mg  10 mg Intravenous Q4H PRN Eduard ClosArshad N Kakrakandy, MD      . levothyroxine (SYNTHROID, LEVOTHROID) tablet 75 mcg  75 mcg Oral QAC breakfast Eduard ClosArshad N Kakrakandy, MD   75 mcg at 07/10/14 [id number]  . lisinopril (PRINIVIL,ZESTRIL) tablet 40 mg  40 mg Oral Daily Eduard ClosArshad N Kakrakandy, MD      . metoprolol succinate (TOPROL-XL) 24 hr tablet 100 mg  100 mg Oral Daily Eduard ClosArshad N Kakrakandy, MD      . mometasone-formoterol (DULERA) 100-5 MCG/ACT inhaler 2 puff  2 puff Inhalation BID Eduard ClosArshad N Kakrakandy, MD      . ondansetron [facility](ZOFRAN) tablet 4 mg  4 mg Oral Q6H PRN Eduard ClosArshad N Kakrakandy, MD       Or  . ondansetron [facility](ZOFRAN) injection 4 mg  4 mg Intravenous Q6H PRN Eduard ClosArshad N Kakrakandy, MD      . simvastatin (ZOCOR) tablet 10 mg  10 mg Oral Daily Eduard ClosArshad N Kakrakandy, MD      . sodium chloride 0.9 % injection 3 mL  3 mL Intravenous Q12H Eduard ClosArshad N Kakrakandy, MD   3 mL  at 07/10/14 0424  . vancomycin (VANCOCIN) IVPB 1000 mg/200 mL premix  1,000 mg Intravenous Q12H Veronda P Bryk, RPH        ROS:   Unable to obtain secondary to dementia  Physical Examination  Filed Vitals:   07/09/14 2330 07/10/14 0000 07/10/14 0028 07/10/14 0307  BP: 135/71 142/92 178/69 140/73  Pulse: 65 51 67 65  Temp:    98.5 F (36.9 C)  TempSrc:    Oral  Resp: 23  20 18   Weight:    216 lb 4.3 oz (98.1 kg)  SpO2: 100% 84% 100% 99%    There is no height on file to calculate BMI.  General:  Alert and oriented, no acute distress HEENT: Normal Neck: No JVD Pulmonary: Clear to auscultation bilaterally Cardiac: Regular Rate and Rhythm  Abdomen: Soft, non-tender, non-distended, ? Large lower abdominal mass vs pt resistance, well healed lower midline scar Skin: No rash, dry gangrene right first toe, thick callus left first toe Extremity Pulses:  2+ radial, brachial,1-2+ femoral absent popliteal dorsalis pedis,  posterior tibial pulses bilaterally Musculoskeletal: No deformity trace right leg edema  Neurologic: Upper and lower extremity motor 5/5 right side, chronic flexion contracture left arm, left leg flaccid weakness  DATA: CBC    Component Value Date/Time   WBC 9.8 07/10/2014 0655   WBC 9.7 03/28/2014   RBC 4.14 07/10/2014 0655   HGB 12.5 07/10/2014 0655   HCT 35.8* 07/10/2014 0655   PLT 401* 07/10/2014 0655   MCV 86.5 07/10/2014 0655   MCH 30.2 07/10/2014 0655   MCHC 34.9 07/10/2014 0655   RDW 13.4 07/10/2014 0655   LYMPHSABS 1.8 07/09/2014 2130   MONOABS 1.3* 07/09/2014 2130   EOSABS 0.1 07/09/2014 2130   BASOSABS 0.0 07/09/2014 2130    BMET    Component Value Date/Time   NA 127* 07/10/2014 0655   K 4.0 07/10/2014 0655   CL 94* 07/10/2014 0655   CO2 25 07/10/2014 0655   GLUCOSE 98 07/10/2014 0655   BUN 6 07/10/2014 0655   BUN 10 03/28/2014   CREATININE 0.81 07/10/2014 0655   CREATININE 0.8 03/28/2014   CALCIUM 9.0 07/10/2014 0655   GFRNONAA >60 07/10/2014 0655   GFRAA >60 07/10/2014 0655    ASSESSMENT/PLAN:  1. Right first toe gangrene.  Pt not really a bypass candidate.  Discussed situation with pt daughter, rather than proceeding with arteriogram which most likely would not reveal any percutaneous options in a patient that is essentially wheelchair bound.  They have opted for right above knee amputation.  This will be scheduled for Monday May 16.  2. Possible abdominal mass will obtain CT abdomen/pelvis to rule out malignant process especially in light of the fact that pt uterus has been removed and mass is primarily lower abdomen  3. Hyponatremia per primary team  4. Protein Calorie malnutrition Albumin 3.1 needs nutrition supplement to improve wound healing potential  Fabienne Brunsharles Martyna Thorns, MD [facility] [place] Office: [phone] Pager: [phone]

## 2014-07-16 LAB — CBC
HCT: 34.7 % — ABNORMAL LOW (ref 36.0–46.0)
Hemoglobin: 11.8 g/dL — ABNORMAL LOW (ref 12.0–15.0)
MCH: 29.9 pg (ref 26.0–34.0)
MCHC: 34 g/dL (ref 30.0–36.0)
MCV: 88.1 fL (ref 78.0–100.0)
PLATELETS: 367 10*3/uL (ref 150–400)
RBC: 3.94 MIL/uL (ref 3.87–5.11)
RDW: 14 % (ref 11.5–15.5)
WBC: 11.8 10*3/uL — AB (ref 4.0–10.5)

## 2014-07-16 LAB — BASIC METABOLIC PANEL
Anion gap: 9 (ref 5–15)
BUN: 8 mg/dL (ref 6–20)
CALCIUM: 9.6 mg/dL (ref 8.9–10.3)
CO2: 21 mmol/L — AB (ref 22–32)
CREATININE: 0.7 mg/dL (ref 0.44–1.00)
Chloride: 100 mmol/L — ABNORMAL LOW (ref 101–111)
GFR calc Af Amer: >60 mL/min (ref >60)
GFR calc non Af Amer: >60 mL/min (ref >60)
GLUCOSE: 107 mg/dL — AB (ref 65–99)
Potassium: 4.5 mmol/L (ref 3.5–5.1)
Sodium: 130 mmol/L — ABNORMAL LOW (ref 135–145)

## 2014-07-16 NOTE — Progress Notes (Signed)
[facility] [place]  Subjective  - no real pain issues   Objective 156/69 68 98.3 F (36.8 C) (Oral) 18 97%  Intake/Output Summary (Last 24 hours) at 07/16/14 0929 Last data filed at 07/16/14 0804  Gross per 24 hour  Intake   1340 ml  Output    300 ml  Net   1040 ml   Right AKA dressing intact  Assessment/Planning:  Doing well post AKA Change dressing tomorrow potentially back to SNF tomorrow.  Fabienne BrunsFields, Charles 07/16/2014 9:29 AM --  Laboratory Lab Results:  Recent Labs  07/15/14 1032 07/16/14 0330  WBC 9.4 11.8*  HGB 13.1 11.8*  HCT 37.9 34.7*  PLT 397 367   BMET  Recent Labs  07/15/14 1032 07/16/14 0330  NA 130* 130*  K 4.4 4.5  CL 97* 100*  CO2 24 21*  GLUCOSE 125* 107*  BUN 6 8  CREATININE 0.78 0.70  CALCIUM 9.6 9.6    COAG No results found for: INR, PROTIME No results found for: PTT

## 2014-07-16 NOTE — Progress Notes (Signed)
Utilization review completed.  

## 2014-07-16 NOTE — Care Management Note (Signed)
Case Management Note  Patient Details  Name: Jolyn LentVera Hunsucker MRN: [id number] Date of Birth: 10-22-1940  Subjective/Objective:      Pt admitted with PAD s/p AKA              Action/Plan: PTA [facility] was at [facility]- CSW following for return to SNF  Expected Discharge Date:  07/18/14               Expected Discharge Plan:  Skilled Nursing Facility  In-House Referral:  Clinical Social Work  Discharge planning Services  CM Consult  Post Acute Care Choice:    Choice offered to:     DME Arranged:    DME Agency:     HH Arranged:    HH Agency:     Status of Service:  In process, will continue to follow  Medicare Important Message Given:    Date Medicare IM Given:    Medicare IM give by:    Date Additional Medicare IM Given:    Additional Medicare Important Message give by:     If discussed at Long Length of Stay Meetings, dates discussed:    Additional Comments:  Darrold SpanWebster, Santhiago Collingsworth Hall, RN 07/16/2014, 10:29 AM

## 2014-07-16 NOTE — Progress Notes (Signed)
Pt IV removed accidently during bath. Nurse tried to reinsert unsuccessfully. 2 IV nurses tried to reinsert unsuccessfully. MD paged.

## 2014-07-16 NOTE — Clinical Social Work Note (Signed)
Clinical Social Work Assessment  Patient Details  Name: Suzanne Rich MRN: [id number] Date of Birth: 06-04-1940  Date of referral:  07/16/14               Reason for consult:  Facility Placement                Permission sought to share information with:  Facility Medical sales representativeContact Representative, Family Supports Permission granted to share information::  Yes, Verbal Permission Granted  Name::     Suzanne HackingSharon Rich  Agency::  [facility]  Relationship::     Contact Information:     Housing/Transportation Living arrangements for the past 2 months:  Skilled Building surveyorursing Facility Source of Information:  Patient Patient Interpreter Needed:  None Criminal Activity/Legal Involvement Pertinent to Current Situation/Hospitalization:  No - Comment as needed Significant Relationships:  Adult Children Lives with:  Facility Resident Do you feel safe going back to the place where you live?  Yes Need for family participation in patient care:  Yes (Comment)  Care giving concerns: none- facility resident   Office managerocial Worker assessment / plan: CSW spoke with pt concerning return to SNF at time of DC  Employment status:  Retired Health and safety inspectornsurance information:  Teacher, English as a foreign languageManaged Medicare, Medicaid [place] PT Recommendations:  Skilled Nursing Facility Information / Referral to community resources:  Skilled Nursing Facility  Patient/Family's Response to care:  Pt agreeable to returning to [facility]- pt reports being a resident at [facility] for the past 4 years  Patient/Family's Understanding of and Emotional Response to Diagnosis, Current Treatment, and Prognosis:  unclear  Emotional Assessment Appearance:  Appears stated age Attitude/Demeanor/Rapport:    Affect (typically observed):  Appropriate, Calm Orientation:    Alcohol / Substance use:  Not Applicable Psych involvement (Current and /or in the community):  No (Comment)  Discharge Needs  Concerns to be addressed:  No discharge needs identified Readmission within  the last 30 days:  Yes (planned readmission) Current discharge risk:  Physical Impairment Barriers to Discharge:  Continued Medical Work up   Peabody EnergyHoloman, Suzanne Sexson M, LCSW 07/16/2014, 10:20 AM

## 2014-07-17 ENCOUNTER — Encounter (HOSPITAL_COMMUNITY): Payer: Self-pay | Admitting: Vascular Surgery

## 2014-07-17 LAB — BASIC METABOLIC PANEL
Anion gap: 7 (ref 5–15)
BUN: 6 mg/dL (ref 6–20)
CHLORIDE: 99 mmol/L — AB (ref 101–111)
CO2: 22 mmol/L (ref 22–32)
Calcium: 9.2 mg/dL (ref 8.9–10.3)
Creatinine, Ser: 0.63 mg/dL (ref 0.44–1.00)
GFR calc Af Amer: >60 mL/min (ref >60)
GFR calc non Af Amer: >60 mL/min (ref >60)
GLUCOSE: 95 mg/dL (ref 65–99)
POTASSIUM: 4.2 mmol/L (ref 3.5–5.1)
Sodium: 128 mmol/L — ABNORMAL LOW (ref 135–145)

## 2014-07-17 LAB — CBC
HCT: 33.6 % — ABNORMAL LOW (ref 36.0–46.0)
Hemoglobin: 11.8 g/dL — ABNORMAL LOW (ref 12.0–15.0)
MCH: 31.2 pg (ref 26.0–34.0)
MCHC: 35.1 g/dL (ref 30.0–36.0)
MCV: 88.9 fL (ref 78.0–100.0)
PLATELETS: 389 10*3/uL (ref 150–400)
RBC: 3.78 MIL/uL — ABNORMAL LOW (ref 3.87–5.11)
RDW: 14.3 % (ref 11.5–15.5)
WBC: 11.8 10*3/uL — AB (ref 4.0–10.5)

## 2014-07-17 NOTE — Progress Notes (Signed)
Patient will discharge [facility] Anticipated discharge date:07/17/14 Family notified:pt to notify Transportation by PTAR- RN to call when pt ready  CSW signing off.  Merlyn LotJenna Holoman, LCSWA Clinical Social Worker [phone]

## 2014-07-17 NOTE — Discharge Summary (Signed)
[facility] Vein Specialists Discharge Summary   Patient ID:  Suzanne Rich MRN: [id number] DOB/AGE: 03-09-1940 74 y.o.  Admit date: 07/15/2014 Discharge date: 07/17/2014 Date of Surgery: 07/15/2014 Surgeon: Surgeon(s): Sherren Kernsharles E Fields, MD  Admission Diagnosis: Dry Gangrene Right First Toe  I96  Discharge Diagnoses:  Dry Gangrene Right First Toe  I96  Secondary Diagnoses: Past Medical History  Diagnosis Date  . Hypertension   . Stroke   . COPD (chronic obstructive pulmonary disease)   . Hypothyroidism   . Hyperlipidemia   . Edema   . CHF (congestive heart failure)   . Gout   . OP (osteoporosis)     Procedure(s): AMPUTATION ABOVE KNEE-RIGHT  Discharged Condition: good  HPI: History of Present Illness: Patient is a 74 y.o. year old female who presents for evaluation of gangrene right first toe with pain. The patient has some dementia and does not know how the problem in her toe started. She complains of intermittent pain but also has chronic numbness in the toe. No fever or chills. 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 Her daughter is her POA and was present for the interview. Other medical problems include hypertension, prior CVA, COPD, hyperlipidemia, CHF, ongoing tobacco abuse. She currently resides at [facility] and has for several years.   Hospital Course:  Suzanne LentVera Rich is a [age] y.o. female is S/P  Procedure(s): AMPUTATION ABOVE KNEE-RIGHT She had an uneventful stay POD# 2 her dressing was changed and clean dry dressing was applied.  Incision was healing well.  Disposition stable.  Significant Diagnostic Studies: CBC Lab Results  Component Value Date   WBC 11.8* 07/17/2014   HGB 11.8* 07/17/2014   HCT 33.6* 07/17/2014   MCV 88.9 07/17/2014   PLT 389 07/17/2014    BMET    Component Value Date/Time   NA 128* 07/17/2014 0436   K 4.2 07/17/2014 0436   CL 99* 07/17/2014 0436   CO2 22 07/17/2014 0436    GLUCOSE 95 07/17/2014 0436   BUN 6 07/17/2014 0436   BUN 10 03/28/2014   CREATININE 0.63 07/17/2014 0436   CREATININE 0.8 03/28/2014   CALCIUM 9.2 07/17/2014 0436   GFRNONAA >60 07/17/2014 0436   GFRAA >60 07/17/2014 0436   COAG No results found for: INR, PROTIME   Disposition:  Discharge to :Skilled nursing facility Discharge Instructions    Activity as tolerated - No restrictions    Complete by:  As directed      Call MD for:  redness, tenderness, or signs of infection (pain, swelling, bleeding, redness, odor or green/yellow discharge around incision site)    Complete by:  As directed      Call MD for:  severe or increased pain, loss or decreased feeling  in affected limb(s)    Complete by:  As directed      Call MD for:  temperature >100.5    Complete by:  As directed      Discharge instructions    Complete by:  As directed   Keep stapes and incision clean and dry- protect from urine.     Resume previous diet    Complete by:  As directed             Medication List    TAKE these medications        ADVAIR DISKUS 100-50 MCG/DOSE Aepb  Generic drug:  Fluticasone-Salmeterol  Inhale 1 puff into the lungs 2 (two) times daily.  aspirin 81 MG tablet  Take 81 mg by mouth daily.     calcium-vitamin D 500-200 MG-UNIT per tablet  Commonly known as:  OSCAL WITH D  Take 1 tablet by mouth 3 (three) times daily.     cephALEXin 500 MG capsule  Commonly known as:  KEFLEX  Take 1 capsule (500 mg total) by mouth 3 (three) times daily.     doxycycline 100 MG tablet  Commonly known as:  VIBRA-TABS  Take 1 tablet (100 mg total) by mouth 2 (two) times daily.     levothyroxine 75 MCG tablet  Commonly known as:  SYNTHROID, LEVOTHROID  Take 75 mcg by mouth daily before breakfast. For Hypothyroidism     lisinopril 10 MG tablet  Commonly known as:  PRINIVIL,ZESTRIL  Take 1 tablet (10 mg total) by mouth daily. For HTN     metoprolol succinate 100 MG 24 hr tablet  Commonly  known as:  TOPROL-XL  Take 100 mg by mouth daily. Take with or immediately following a meal.     polyethylene glycol packet  Commonly known as:  MIRALAX  Take 17 g by mouth daily.     simvastatin 10 MG tablet  Commonly known as:  ZOCOR  Take 10 mg by mouth daily. For Hyperlipidemia     spironolactone 25 MG tablet  Commonly known as:  ALDACTONE  Take 25 mg by mouth daily.     Vitamin D3 1000 UNITS Caps  Take 2,000 Units by mouth daily. 2 by mouth daily       Verbal and written Discharge instructions given to the patient. Wound care per Discharge AVS     Follow-up Information    Follow up with Fabienne BrunsFields, Charles, MD In 4 weeks.   Specialties:  Vascular Surgery, Cardiology   Why:  sent message to office   Contact information:   [address] [place] [address] [phone]       Signed: Clinton GallantCOLLINS, Traveion Ruddock [facility] 07/17/2014, 12:19 PM

## 2014-07-17 NOTE — Progress Notes (Addendum)
[facility] [place]  Subjective  - Patient is comfortable.  Dressing was saturated with urine.   Objective 152/65 64 98.4 F (36.9 C) (Oral) 18 100%  Intake/Output Summary (Last 24 hours) at 07/17/14 0857 Last data filed at 07/17/14 0234  Gross per 24 hour  Intake    320 ml  Output      0 ml  Net    320 ml    Right AKA dressing is saturated with urine for the second time. Incision healing fine without wound break down  Clean dry dressing applied   Assessment/Planning: POD # 2 Right AKA She is afebrile the incision is healing well, but will need to be watched carefully to make sure it does not get infected. Plan d/c to SNF f/u with Dr. Darrick PennaFields in 4 weeks  Thomasena EdisCOLLINS, Bethesda NorthEMMA [facility] 07/17/2014 8:57 AM -- Agree with above.  AkA healing so far. Pain controlled   Will d/c back to [facility] today  Fabienne Brunsharles Karalina Tift, MD [facility] [place] Office: [phone] Pager: [phone]  Laboratory Lab Results:  Recent Labs  07/16/14 0330 07/17/14 0436  WBC 11.8* 11.8*  HGB 11.8* 11.8*  HCT 34.7* 33.6*  PLT 367 389   BMET  Recent Labs  07/16/14 0330 07/17/14 0436  NA 130* 128*  K 4.5 4.2  CL 100* 99*  CO2 21* 22  GLUCOSE 107* 95  BUN 8 6  CREATININE 0.70 0.63  CALCIUM 9.6 9.2    COAG No results found for: INR, PROTIME No results found for: PTT

## 2014-07-17 NOTE — Progress Notes (Signed)
Patient being transported off the Unit at this time; discharged.  Discharge paperwork with transporters with no further issues or question at this time.  Report called and discussed with Neysa HotterS Washington, RN.

## 2014-07-18 ENCOUNTER — Other Ambulatory Visit: Payer: Self-pay | Admitting: *Deleted

## 2014-07-18 MED ORDER — TRAMADOL HCL 50 MG PO TABS: ORAL_TABLET | ORAL | Status: DC

## 2014-07-18 NOTE — Telephone Encounter (Signed)
[facility] 

## 2014-07-21 LAB — BASIC METABOLIC PANEL
BUN: 12 mg/dL (ref 4–21)
Creatinine: 0.7 mg/dL (ref 0.5–1.1)
Glucose: 101 mg/dL
Potassium: 5 mmol/L (ref 3.4–5.3)
Sodium: 130 mmol/L — AB (ref 137–147)

## 2014-07-21 LAB — CBC AND DIFFERENTIAL
HEMATOCRIT: 38 % (ref 36–46)
HEMOGLOBIN: 12.8 g/dL (ref 12.0–16.0)
PLATELETS: 496 10*3/uL — AB (ref 150–399)
WBC: 9.9 10^3/mL

## 2014-07-23 ENCOUNTER — Non-Acute Institutional Stay (SKILLED_NURSING_FACILITY): Payer: Medicare Other | Admitting: Internal Medicine

## 2014-07-23 DIAGNOSIS — N189 Chronic kidney disease, unspecified: Secondary | ICD-10-CM | POA: Diagnosis not present

## 2014-07-23 DIAGNOSIS — K5900 Constipation, unspecified: Secondary | ICD-10-CM

## 2014-07-23 DIAGNOSIS — I69854 Hemiplegia and hemiparesis following other cerebrovascular disease affecting left non-dominant side: Secondary | ICD-10-CM

## 2014-07-23 DIAGNOSIS — I509 Heart failure, unspecified: Secondary | ICD-10-CM

## 2014-07-23 DIAGNOSIS — E559 Vitamin D deficiency, unspecified: Secondary | ICD-10-CM | POA: Diagnosis not present

## 2014-07-23 DIAGNOSIS — I130 Hypertensive heart and chronic kidney disease with heart failure and stage 1 through stage 4 chronic kidney disease, or unspecified chronic kidney disease: Secondary | ICD-10-CM | POA: Diagnosis not present

## 2014-07-23 DIAGNOSIS — F17200 Nicotine dependence, unspecified, uncomplicated: Secondary | ICD-10-CM

## 2014-07-23 DIAGNOSIS — E039 Hypothyroidism, unspecified: Secondary | ICD-10-CM | POA: Diagnosis not present

## 2014-07-23 DIAGNOSIS — J439 Emphysema, unspecified: Secondary | ICD-10-CM

## 2014-07-23 DIAGNOSIS — E871 Hypo-osmolality and hyponatremia: Secondary | ICD-10-CM | POA: Diagnosis not present

## 2014-07-23 DIAGNOSIS — Z720 Tobacco use: Secondary | ICD-10-CM | POA: Diagnosis not present

## 2014-07-23 DIAGNOSIS — E785 Hyperlipidemia, unspecified: Secondary | ICD-10-CM | POA: Diagnosis not present

## 2014-07-23 DIAGNOSIS — I69354 Hemiplegia and hemiparesis following cerebral infarction affecting left non-dominant side: Secondary | ICD-10-CM

## 2014-07-23 DIAGNOSIS — IMO0001 Reserved for inherently not codable concepts without codable children: Secondary | ICD-10-CM

## 2014-07-23 DIAGNOSIS — I96 Gangrene, not elsewhere classified: Secondary | ICD-10-CM | POA: Diagnosis not present

## 2014-07-23 NOTE — Progress Notes (Signed)
Patient ID: Suzanne Rich, female   DOB: Dec 15, 1940, 74 y.o.   MRN: [id number]      [facility]   PCP: No PCP Per Patient  Code Status: DNR  No Known Allergies  Chief Complaint  Patient presents with  . Readmit To SNF     HPI:  74 year old patient is here for long term care post hospital admission from 07/09/14-07/12/14 with dry gangrene of the toes and acute on chronic hyponatremia. Above knee amputation was recommended by vascular team. Patient was discharged back to facility and readmitted from 07/15/14- 07/17/14 for right above knee amputation. She is seen in her room today. She is in no distress, denies any complaints. Continues to smoke 1 cigarette a day She has history of stroke with left-sided hemiparesis, ongoing tobacco abuse and COPD, hypertension, CKD and hyperlipidemia.  Review of Systems:  Constitutional: Negative for fever, chills, diaphoresis.  HENT: Negative for headache, congestion, nasal discharge Eyes: Negative for eye pain, blurred vision, double vision and discharge.  Respiratory: Negative for cough, shortness of breath and wheezing.   Cardiovascular: Negative for chest pain, palpitations, leg swelling.  Gastrointestinal: Negative for heartburn, nausea, vomiting, abdominal pain Genitourinary: Negative for dysuria Musculoskeletal: Negative for back pain, falls Skin: Negative for itching, rash.  Neurological: Negative for dizziness, tingling Psychiatric/Behavioral: Negative for depression   Past Medical History  Diagnosis Date  . Hypertension   . Stroke   . COPD (chronic obstructive pulmonary disease)   . Hypothyroidism   . Hyperlipidemia   . Edema   . CHF (congestive heart failure)   . Gout   . OP (osteoporosis)    Past Surgical History  Procedure Laterality Date  . Abdominal hysterectomy    . Amputation Right 07/15/2014    Procedure: AMPUTATION ABOVE KNEE-RIGHT;  Surgeon: Sherren Kernsharles E Fields, MD;  Location: [facility] OR;   Service: Vascular;  Laterality: Right;   Social History:   reports that she has been smoking.  She does not have any smokeless tobacco history on file. She reports that she does not drink alcohol. Her drug history is not on file.  Family History  Problem Relation Age of Onset  . Colon cancer Mother   . Ovarian cancer Mother   . Emphysema Sister   . Heart failure Daughter     Medications: Patient's Medications  New Prescriptions   No medications on file  Previous Medications   ASPIRIN 81 MG TABLET    Take 81 mg by mouth daily.   CALCIUM-VITAMIN D (OSCAL WITH D) 500-200 MG-UNIT PER TABLET    Take 1 tablet by mouth 3 (three) times daily.   CEPHALEXIN (KEFLEX) 500 MG CAPSULE    Take 1 capsule (500 mg total) by mouth 3 (three) times daily.   CHOLECALCIFEROL (VITAMIN D3) 1000 UNITS CAPS    Take 2,000 Units by mouth daily. 2 by mouth daily   DOXYCYCLINE (VIBRA-TABS) 100 MG TABLET    Take 1 tablet (100 mg total) by mouth 2 (two) times daily.   FLUTICASONE-SALMETEROL (ADVAIR DISKUS) 100-50 MCG/DOSE AEPB    Inhale 1 puff into the lungs 2 (two) times daily.   LEVOTHYROXINE (SYNTHROID, LEVOTHROID) 75 MCG TABLET    Take 75 mcg by mouth daily before breakfast. For Hypothyroidism   LISINOPRIL (PRINIVIL,ZESTRIL) 10 MG TABLET    Take 1 tablet (10 mg total) by mouth daily. For HTN   METOPROLOL SUCCINATE (TOPROL-XL) 100 MG 24 HR TABLET    Take 100 mg by mouth  daily. Take with or immediately following a meal.   POLYETHYLENE GLYCOL (MIRALAX) PACKET    Take 17 g by mouth daily.   SIMVASTATIN (ZOCOR) 10 MG TABLET    Take 10 mg by mouth daily. For Hyperlipidemia   SPIRONOLACTONE (ALDACTONE) 25 MG TABLET    Take 25 mg by mouth daily.   TRAMADOL (ULTRAM) 50 MG TABLET    Take one tablet by mouth every 6 hours as needed for pain  Modified Medications   No medications on file  Discontinued Medications   No medications on file     Physical Exam:  BP 129/77 mmHg  Pulse 68  Temp(Src) 98 F (36.7 C)  Resp  18  Wt 150 lb 9.6 oz (68.312 kg)  SpO2 99%  General- elderly female in no acute distress Head- atraumatic, normocephalic Eyes- PERRLA, EOMI, no pallor, no icterus, no discharge Neck- no lymphadenopathy Mouth- normal mucus membrane, adentulous Cardiovascular- normal s1,s2, no murmurs, weak left dorsalis pedis Respiratory- bilateral clear to auscultation, no wheeze, no rhonchi, no crackles Abdomen- bowel sounds present, soft, non tender Musculoskeletal- able to move right UE. Right AKA. Left sided hemiparesis LUE>LLE. Trace leg edema.  Neurological- no focal deficit Skin- warm and dry Psychiatry- alert and oriented, normal mood and affect   Labs reviewed: Basic Metabolic Panel:  Recent Labs  42/59/56 1032 07/16/14 0330 07/17/14 0436  NA 130* 130* 128*  K 4.4 4.5 4.2  CL 97* 100* 99*  CO2 24 21* 22  GLUCOSE 125* 107* 95  BUN CREATININE 0.78 0.70 0.63  CALCIUM 9.6 9.6 9.2   Liver Function Tests:  Recent Labs  07/10/14 0434  AST 24  ALT 18  ALKPHOS 77  BILITOT 0.7  PROT 6.2*  ALBUMIN 3.1*   No results for input(s): LIPASE, AMYLASE in the last 8760 hours. No results for input(s): AMMONIA in the last 8760 hours. CBC:  Recent Labs  07/09/14 2130  07/15/14 1032 07/16/14 0330 07/17/14 0436  WBC 11.2*  < > 9.4 11.8* 11.8*  NEUTROABS 8.0*  --   --   --   --   HGB 12.7  < > 13.1 11.8* 11.8*  HCT 36.4  < > 37.9 34.7* 33.6*  MCV 88.1  < > 88.8 88.1 88.9  PLT 398  < > 397 367 389  < > = values in this interval not displayed.   Assessment/Plan  Right toe gangrene S/p right AKA. Continue skin care. Has f/u with vascular surgery. Continue keflex 500 mg tid and doxycycline 100 mg bid for now. Continue tramadol 50 mg q6h prn pain. Verify on stop date for antibiotics with vascular surgery. Will have patient work with PT/OT as tolerated to regain strength and restore function.  Fall precautions are in place.  Hypertensive heart and renal disease Stable bp,  continue metoprolol 100 mg daily, lisinopril 10 mg daily. 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 Fall precautions, assistance with ADLs  CHF Euvolemic, continue aldactone 25 mg daily, metoprolol 100 mg daily, lisinopril 10 mg daily  Chronic hyponatremia Monitor clinically, recheck bmp and if na stays low, add salt tablet  Constipation Continue miralax daily  Hyperlipidemia continue zocor 10 mg daily  Hypothyroidism continue levothyroxine 75 mcg daily  Vitamin d def Continue vitamin d supplement with os cal  Tobacco use disorder Continues to smoke, counselled on smoking cessation  Copd On adviar at present, continues to  smoke which can worsen this, not willing to quit  Oneal Grout, MD  [facility] [phone] (Monday-Friday 8 am - 5 pm) [phone] (afterhours)     Goals of care: long term care   Labs/tests ordered: cbc with diff, cmp  Family/ staff Communication: reviewed care plan with patient and nursing supervisor    Oneal Grout, MD  [facility] [phone] (Monday-Friday 8 am - 5 pm) [phone] (afterhours)

## 2014-07-24 DIAGNOSIS — E871 Hypo-osmolality and hyponatremia: Secondary | ICD-10-CM | POA: Insufficient documentation

## 2014-07-24 DIAGNOSIS — E559 Vitamin D deficiency, unspecified: Secondary | ICD-10-CM | POA: Insufficient documentation

## 2014-07-24 DIAGNOSIS — I69354 Hemiplegia and hemiparesis following cerebral infarction affecting left non-dominant side: Secondary | ICD-10-CM | POA: Insufficient documentation

## 2014-07-24 DIAGNOSIS — I110 Hypertensive heart disease with heart failure: Secondary | ICD-10-CM | POA: Insufficient documentation

## 2014-07-25 ENCOUNTER — Telehealth: Payer: Self-pay

## 2014-07-25 NOTE — Telephone Encounter (Signed)
[facility].  Reported redness of peri-incision with medial area showing signs of swelling and fluctuance.  Stated temp. 99-100 degrees today.  Reported pt. has been on Keflex and Doxycycline.  Reported CBC and BMET checked and normal.  Denied any drainage from incision.  Questioned about adding another antibiotic?  Spoke with Dr. Darrick PennaFields.  Per Dr. Darrick PennaFields, the pt's. fever is very low grade, and she shouldn't need to be on antibiotics from having an above knee amputation.  Recommended to schedule appt. for f/u on 08/01/14, and if symptoms worsen, can bring her in sooner for eval.  [facility]. Darrick PennaFields response and recommendation.  Advised will call the Nursing Home with an appt. for 6/2, and encouraged to call office if symptoms worsen prior to appt. on 6/2.  Verb. Understanding.

## 2014-07-26 ENCOUNTER — Telehealth: Payer: Self-pay | Admitting: Family

## 2014-07-26 NOTE — Telephone Encounter (Signed)
LM for Shanda BumpsJessica with appt date/time. dpm

## 2014-07-26 NOTE — Telephone Encounter (Signed)
I have left 2 messages for Suzanne BumpsJessica at [facility].

## 2014-07-26 NOTE — Telephone Encounter (Signed)
-----   Message from Phillips Odorarol S Pullins, RN sent at 07/23/2014 11:49 AM EDT ----- Regarding: need to contact nursing home I rec'd phone call from a PA that oversees this pt[facility] care [facility].  She asked that someone notify [facility] of the post op appt. in June.  Can you please call them to make sure it is on their calendar? Thanks.

## 2014-07-30 ENCOUNTER — Encounter: Payer: Self-pay | Admitting: Vascular Surgery

## 2014-08-01 ENCOUNTER — Encounter: Payer: Self-pay | Admitting: Vascular Surgery

## 2014-08-01 ENCOUNTER — Ambulatory Visit (INDEPENDENT_AMBULATORY_CARE_PROVIDER_SITE_OTHER): Payer: Self-pay | Admitting: Vascular Surgery

## 2014-08-01 VITALS — BP 132/79 | HR 66 | Resp 18 | Ht 66.0 in | Wt 157.0 lb

## 2014-08-01 DIAGNOSIS — I739 Peripheral vascular disease, unspecified: Secondary | ICD-10-CM

## 2014-08-01 NOTE — Progress Notes (Signed)
  POST OPERATIVE OFFICE NOTE    CC:  F/u for surgery  HPI:  This is a 74 y.o. female who is s/p right AKA.  She reports feeling well and has less pain than before the amputation.  No Known Allergies  Current Outpatient Prescriptions  Medication Sig Dispense Refill  . acetaminophen (TYLENOL) 650 MG CR tablet Take 650 mg by mouth every 8 (eight) hours as needed for pain.    Marland Kitchen. aspirin 81 MG tablet Take 81 mg by mouth daily.    . calcium-vitamin D (OSCAL WITH D) 500-200 MG-UNIT per tablet Take 1 tablet by mouth 3 (three) times daily.    . cephALEXin (KEFLEX) 500 MG capsule Take 1 capsule (500 mg total) by mouth 3 (three) times daily.    . Cholecalciferol (VITAMIN D3) 1000 UNITS CAPS Take 2,000 Units by mouth daily. 2 by mouth daily    . doxycycline (VIBRA-TABS) 100 MG tablet Take 1 tablet (100 mg total) by mouth 2 (two) times daily.    . Fluticasone-Salmeterol (ADVAIR DISKUS) 100-50 MCG/DOSE AEPB Inhale 1 puff into the lungs 2 (two) times daily.    Marland Kitchen. levothyroxine (SYNTHROID, LEVOTHROID) 75 MCG tablet Take 75 mcg by mouth daily before breakfast. For Hypothyroidism    . lisinopril (PRINIVIL,ZESTRIL) 10 MG tablet Take 1 tablet (10 mg total) by mouth daily. For HTN    . metoprolol succinate (TOPROL-XL) 100 MG 24 hr tablet Take 100 mg by mouth daily. Take with or immediately following a meal.    . polyethylene glycol (MIRALAX) packet Take 17 g by mouth daily.    . simvastatin (ZOCOR) 10 MG tablet Take 10 mg by mouth daily. For Hyperlipidemia    . spironolactone (ALDACTONE) 25 MG tablet Take 25 mg by mouth daily.    . traMADol (ULTRAM) 50 MG tablet Take one tablet by mouth every 6 hours as needed for pain 120 tablet 5   No current facility-administered medications for this visit.     ROS:  See HPI  Physical Exam:  Filed Vitals:   08/01/14 1607  BP: 132/79  Pulse: 66  Resp: 18    Incision:  Healing well no openings in the skin, staples intact Extremities:  Left foot clean and dry  without ulcers   Assessment/Plan:  This is a 74 y.o. female who is s/p: right AKA Healing well.  She will f/u at the 4 weeks post-op for staple removal and incision check.       Thomasena EdisCOLLINS, Deandre Stansel [facility] PA-C [facility] [phone]  Clinic MD:  Pt seen and examined with Dr. Darrick PennaFields   History and exam findings as above. Above-knee amputation is healing well. There is some mild erythema around the staples but this does not blanch suggesting there is no cellulitis. This is most likely staple irritation. She will keep her regularly scheduled appointment to remove her staples in a few weeks.  Fabienne Brunsharles Fields, MD [facility] [place] Office: [phone] Pager: [phone]

## 2014-08-05 ENCOUNTER — Encounter: Payer: Self-pay | Admitting: Vascular Surgery

## 2014-08-06 LAB — TSH: TSH: 1.98 u[IU]/mL (ref <=5.90)

## 2014-08-08 ENCOUNTER — Encounter: Payer: Self-pay | Admitting: Vascular Surgery

## 2014-08-08 ENCOUNTER — Ambulatory Visit (INDEPENDENT_AMBULATORY_CARE_PROVIDER_SITE_OTHER): Payer: Self-pay | Admitting: Vascular Surgery

## 2014-08-08 VITALS — BP 117/69 | HR 64 | Temp 97.8°F | Resp 14 | Ht 66.0 in

## 2014-08-08 DIAGNOSIS — I739 Peripheral vascular disease, unspecified: Secondary | ICD-10-CM

## 2014-08-08 NOTE — Progress Notes (Signed)
Patient is status post right above-knee amputation on 07/15/2014. The AKA is healing well. Staples were removed today. She will follow-up on as-needed basis.  Fabienne Bruns, MD [facility] [place] Office: [phone] Pager: [phone]

## 2014-08-09 ENCOUNTER — Encounter: Payer: Self-pay | Admitting: Internal Medicine

## 2014-08-14 ENCOUNTER — Non-Acute Institutional Stay (SKILLED_NURSING_FACILITY): Payer: Medicare Other | Admitting: Internal Medicine

## 2014-08-14 ENCOUNTER — Encounter: Payer: Self-pay | Admitting: Internal Medicine

## 2014-08-14 DIAGNOSIS — I509 Heart failure, unspecified: Secondary | ICD-10-CM | POA: Diagnosis not present

## 2014-08-14 DIAGNOSIS — I130 Hypertensive heart and chronic kidney disease with heart failure and stage 1 through stage 4 chronic kidney disease, or unspecified chronic kidney disease: Secondary | ICD-10-CM | POA: Diagnosis not present

## 2014-08-14 DIAGNOSIS — E039 Hypothyroidism, unspecified: Secondary | ICD-10-CM | POA: Diagnosis not present

## 2014-08-14 DIAGNOSIS — I739 Peripheral vascular disease, unspecified: Secondary | ICD-10-CM | POA: Diagnosis not present

## 2014-08-14 DIAGNOSIS — N189 Chronic kidney disease, unspecified: Secondary | ICD-10-CM

## 2014-08-14 DIAGNOSIS — IMO0001 Reserved for inherently not codable concepts without codable children: Secondary | ICD-10-CM

## 2014-08-14 NOTE — Progress Notes (Signed)
Patient ID: Suzanne Rich, female   DOB: 30-Jun-1940, 74 y.o.   MRN: [id number]    [facility]  Code Status: DNR  Chief Complaint  Patient presents with  . Medical Management of Chronic Issues    Routine Visit     No Known Allergies   HPI:  74 year old patient is seen for routine visit. She is in her room, lying in her bed watching television. Restorative team is now here to work with her. She denies any concerns. She mentions that her incision has healed well, stitches have been removed and she feels good. Denies phantom pain. Continues to smoke 1 cigarette a day. She has history of stroke with left-sided hemiparesis, ongoing tobacco abuse and COPD, hypertension, PAD with recent right AKA, CKD and hyperlipidemia.  Review of Systems:  Constitutional: Negative for fever, chills, diaphoresis.  HENT: Negative for headache, congestion, nasal discharge Eyes: Negative for eye pain, blurred vision, double vision and discharge.  Respiratory: Negative for cough, shortness of breath and wheezing.   Cardiovascular: Negative for chest pain, palpitations, leg swelling.  Gastrointestinal: Negative for heartburn, nausea, vomiting, abdominal pain Genitourinary: Negative for dysuria Musculoskeletal: Negative for back pain, falls Skin: Negative for itching, rash.  Neurological: Negative for dizziness, tingling Psychiatric/Behavioral: Negative for depression   Past Medical History  Diagnosis Date  . Hypertension   . Stroke   . COPD (chronic obstructive pulmonary disease)   . Hypothyroidism   . Hyperlipidemia   . Edema   . CHF (congestive heart failure)   . Gout   . OP (osteoporosis)      Family History  Problem Relation Age of Onset  . Colon cancer Mother   . Ovarian cancer Mother   . Emphysema Sister   . Heart failure Daughter     Medications: Patient's Medications  New Prescriptions   No medications on file  Previous Medications   ACETAMINOPHEN (TYLENOL) 650  MG CR TABLET    Take 650 mg by mouth every 8 (eight) hours as needed for pain.   AMBULATORY NON FORMULARY MEDICATION    Medication Name: Med Pass 90 mL three times daily for nutritional support   ASPIRIN 81 MG TABLET    Take 81 mg by mouth daily.   CALCIUM-VITAMIN D (OSCAL WITH D) 500-200 MG-UNIT PER TABLET    Take 1 tablet by mouth 3 (three) times daily.   CHOLECALCIFEROL (VITAMIN D3) 1000 UNITS CAPS    Take 2,000 Units by mouth daily. 2 by mouth daily   FLUTICASONE-SALMETEROL (ADVAIR DISKUS) 100-50 MCG/DOSE AEPB    Inhale 1 puff into the lungs 2 (two) times daily.   LEVOTHYROXINE (SYNTHROID, LEVOTHROID) 75 MCG TABLET    Take 75 mcg by mouth daily before breakfast. For Hypothyroidism   LISINOPRIL (PRINIVIL,ZESTRIL) 10 MG TABLET    Take 1 tablet (10 mg total) by mouth daily. For HTN   METOPROLOL SUCCINATE (TOPROL-XL) 100 MG 24 HR TABLET    Take 100 mg by mouth daily. Take with or immediately following a meal.   POLYETHYLENE GLYCOL (MIRALAX) PACKET    Take 17 g by mouth daily.   SIMVASTATIN (ZOCOR) 10 MG TABLET    Take 10 mg by mouth daily. For Hyperlipidemia   SPIRONOLACTONE (ALDACTONE) 25 MG TABLET    Take 25 mg by mouth daily.   TRAMADOL (ULTRAM) 50 MG TABLET    Take one tablet by mouth every 6 hours as needed for pain  Modified Medications   No medications on  file  Discontinued Medications   CEPHALEXIN (KEFLEX) 500 MG CAPSULE    Take 1 capsule (500 mg total) by mouth 3 (three) times daily.   DOXYCYCLINE (VIBRA-TABS) 100 MG TABLET    Take 1 tablet (100 mg total) by mouth 2 (two) times daily.     Physical Exam:  BP 119/69 mmHg  Pulse 83  Temp(Src) 97 F (36.1 C) (Oral)  Resp 20  Ht 5\' 3"  (1.6 m)  Wt 144 lb 3.2 oz (65.409 kg)  BMI 25.55 kg/m2  SpO2 90%  Wt Readings from Last 3 Encounters:  08/14/14 144 lb 3.2 oz (65.409 kg)  08/01/14 157 lb (71.215 kg)  07/23/14 150 lb 9.6 oz (68.312 kg)   General- elderly female in no acute distress Head- atraumatic, normocephalic Eyes-  PERRLA, EOMI, no pallor, no icterus, no discharge Neck- no lymphadenopathy, no jvd Mouth- normal mucus membrane, adentulous Cardiovascular- normal s1,s2, no murmurs, weak left dorsalis pedis Respiratory- bilateral clear to auscultation, no wheeze, no rhonchi, no crackles Abdomen- bowel sounds present, soft, non tender Musculoskeletal- able to move right UE. Right AKA. Left sided hemiparesis LUE>LLE. Trace left leg edema.  Neurological- no focal deficit Skin- warm and dry, right AKA incision site healing well, no signs of infection Psychiatry- alert and oriented, normal mood and affect   Labs reviewed: Basic Metabolic Panel:  Recent Labs  83/66/29 1032 07/16/14 0330 07/17/14 0436 07/21/14  NA 130* 130* 128* 130*  K 4.4 4.5 4.2 5.0  CL 97* 100* 99*  --   CO2 24 21* 22  --   GLUCOSE 125* 107* 95  --   BUN 6 8 6 12   CREATININE 0.78 0.70 0.63 0.7  CALCIUM 9.6 9.6 9.2  --    Liver Function Tests:  Recent Labs  07/09/14 07/10/14 0434  AST 21 24  ALT 15 18  ALKPHOS 83 77  BILITOT  --  0.7  PROT  --  6.2*  ALBUMIN  --  3.1*   No results for input(s): LIPASE, AMYLASE in the last 8760 hours. No results for input(s): AMMONIA in the last 8760 hours. CBC:  Recent Labs  07/09/14 2130  07/15/14 1032 07/16/14 0330 07/17/14 0436 07/21/14  WBC 11.2*  < > 9.4 11.8* 11.8* 9.9  NEUTROABS 8.0*  --   --   --   --   --   HGB 12.7  < > 13.1 11.8* 11.8* 12.8  HCT 36.4  < > 37.9 34.7* 33.6* 38  MCV 88.1  < > 88.8 88.1 88.9  --   PLT 398  < > 397 367 389 496*  < > = values in this interval not displayed.  Lab Results  Component Value Date   TSH 1.98 08/06/2014     Assessment/Plan  Hypertensive heart and renal disease Stable bp, continue metoprolol 100 mg daily, lisinopril 10 mg daily. Monitor bp  PAD S/p right AKA. Pain under control. Change tramadol to 50 mg q8 prn for pain. Has followed with surgery and incision healing well. Continue aspirin 81 mg daily. Continue skin  care. Fall precautions are in place. Continue working with restorative team. Continues to smoke, not willing to quit, understands risk  Hypothyroidism continue levothyroxine 75 mcg daily   Oneal Grout, MD  [place] [facility] [phone] (Monday-Friday 8 am - 5 pm) [phone] (afterhours)     Goals of care: long term care   Labs/tests ordered: cbc with diff, cmp  Family/ staff Communication: reviewed care plan with patient and nursing  supervisor    Blanchie Serve, MD  [facility] [phone] (Monday-Friday 8 am - 5 pm) [phone] (afterhours)

## 2014-09-18 ENCOUNTER — Encounter: Payer: Self-pay | Admitting: Internal Medicine

## 2014-09-18 ENCOUNTER — Non-Acute Institutional Stay (SKILLED_NURSING_FACILITY): Payer: Medicare Other | Admitting: Internal Medicine

## 2014-09-18 DIAGNOSIS — F17200 Nicotine dependence, unspecified, uncomplicated: Secondary | ICD-10-CM

## 2014-09-18 DIAGNOSIS — I10 Essential (primary) hypertension: Secondary | ICD-10-CM

## 2014-09-18 DIAGNOSIS — E039 Hypothyroidism, unspecified: Secondary | ICD-10-CM

## 2014-09-18 DIAGNOSIS — Z720 Tobacco use: Secondary | ICD-10-CM

## 2014-09-18 DIAGNOSIS — K5901 Slow transit constipation: Secondary | ICD-10-CM | POA: Diagnosis not present

## 2014-09-18 NOTE — Progress Notes (Signed)
Patient ID: Suzanne Rich, female   DOB: 01-05-1941, 74 y.o.   MRN: [id number]   [facility]  Code Status: DNR  No Known Allergies   Chief Complaint  Patient presents with  . Medical Management of Chronic Issues    Routine Visit     HPI:  74 year old patient is seen for routine visit. She is sitting on her wheelchair and in no distress. She denies any concerns. Denies phantom pain. Continues to smoke 1 cigarette a day. She has history of stroke with left-sided hemiparesis, ongoing tobacco abuse and COPD, hypertension, PAD with recent right AKA, CKD and hyperlipidemia. No new concern from staff.  Review of Systems:  Constitutional: Negative for fever, chills, diaphoresis.  HENT: Negative for headache, congestion, nasal discharge Eyes: Negative for eye pain, blurred vision, double vision and discharge.  Respiratory: Negative for cough, shortness of breath and wheezing.   Cardiovascular: Negative for chest pain, palpitations, leg swelling.  Gastrointestinal: Negative for heartburn, nausea, vomiting, abdominal pain Genitourinary: Negative for dysuria Musculoskeletal: Negative for back pain, falls Skin: Negative for itching, rash.  Neurological: Negative for dizziness, tingling Psychiatric/Behavioral: Negative for depression   Past Medical History  Diagnosis Date  . Hypertension   . Stroke   . COPD (chronic obstructive pulmonary disease)   . Hypothyroidism   . Hyperlipidemia   . Edema   . CHF (congestive heart failure)   . Gout   . OP (osteoporosis)      Family History  Problem Relation Age of Onset  . Colon cancer Mother   . Ovarian cancer Mother   . Emphysema Sister   . Heart failure Daughter     Medications: Patient's Medications  New Prescriptions   No medications on file  Previous Medications   ACETAMINOPHEN (TYLENOL) 650 MG CR TABLET    Take 650 mg by mouth every 8 (eight) hours as needed for pain.   AMBULATORY NON FORMULARY  MEDICATION    Medication Name: Med Pass 90 mL four times daily for nutritional support   ASPIRIN 81 MG TABLET    Take 81 mg by mouth daily.   CALCIUM-VITAMIN D (OSCAL WITH D) 500-200 MG-UNIT PER TABLET    Take 1 tablet by mouth 3 (three) times daily.   CHOLECALCIFEROL (VITAMIN D3) 1000 UNITS CAPS    Take 2,000 Units by mouth daily. 2 by mouth daily   FLUTICASONE-SALMETEROL (ADVAIR DISKUS) 100-50 MCG/DOSE AEPB    Inhale 1 puff into the lungs 2 (two) times daily.   LEVOTHYROXINE (SYNTHROID, LEVOTHROID) 75 MCG TABLET    Take 75 mcg by mouth daily before breakfast. For Hypothyroidism   LISINOPRIL (PRINIVIL,ZESTRIL) 10 MG TABLET    Take 1 tablet (10 mg total) by mouth daily. For HTN   METOPROLOL SUCCINATE (TOPROL-XL) 100 MG 24 HR TABLET    Take 100 mg by mouth daily. Take with or immediately following a meal.   POLYETHYLENE GLYCOL (MIRALAX) PACKET    Take 17 g by mouth daily.   SIMVASTATIN (ZOCOR) 10 MG TABLET    Take 10 mg by mouth daily. For Hyperlipidemia   SPIRONOLACTONE (ALDACTONE) 25 MG TABLET    Take 25 mg by mouth daily.  Modified Medications   No medications on file  Discontinued Medications   TRAMADOL (ULTRAM) 50 MG TABLET    Take one tablet by mouth every 6 hours as needed for pain     Physical Exam:  BP 116/54 mmHg  Pulse 58  Temp(Src) 99.2  F (37.3 C) (Oral)  Resp 16  SpO2 96%  Wt Readings from Last 3 Encounters:  08/14/14 144 lb 3.2 oz (65.409 kg)  08/01/14 157 lb (71.215 kg)  07/23/14 150 lb 9.6 oz (68.312 kg)   General- elderly female in no acute distress Head- atraumatic, normocephalic Eyes- PERRLA, EOMI, no pallor, no icterus, no discharge Neck- no lymphadenopathy, no jvd Mouth- normal mucus membrane, adentulous Cardiovascular- normal s1,s2, no murmurs, weak left dorsalis pedis Respiratory- bilateral clear to auscultation, no wheeze, no rhonchi, no crackles Abdomen- bowel sounds present, soft, non tender Musculoskeletal- able to move right UE. Right AKA. Left  sided hemiparesis LUE>LLE. Trace left leg edema.  Neurological- no focal deficit Skin- warm and dry, right AKA incision site healed well, no signs of infection Psychiatry- alert and oriented, normal mood and affect   Labs reviewed: Basic Metabolic Panel:  Recent Labs  16/11/9603/16/16 1032 07/16/14 0330 07/17/14 0436 07/21/14  NA 130* 130* 128* 130*  K 4.4 4.5 4.2 5.0  CL 97* 100* 99*  --   CO2 24 21* 22  --   GLUCOSE 125* 107* 95  --   BUN 6 8 6 12   CREATININE 0.78 0.70 0.63 0.7  CALCIUM 9.6 9.6 9.2  --    Liver Function Tests:  Recent Labs  07/09/14 07/10/14 0434  AST 21 24  ALT 15 18  ALKPHOS 83 77  BILITOT  --  0.7  PROT  --  6.2*  ALBUMIN  --  3.1*   No results for input(s): LIPASE, AMYLASE in the last 8760 hours. No results for input(s): AMMONIA in the last 8760 hours. CBC:  Recent Labs  07/09/14 2130  07/15/14 1032 07/16/14 0330 07/17/14 0436 07/21/14  WBC 11.2*  < > 9.4 11.8* 11.8* 9.9  NEUTROABS 8.0*  --   --   --   --   --   HGB 12.7  < > 13.1 11.8* 11.8* 12.8  HCT 36.4  < > 37.9 34.7* 33.6* 38  MCV 88.1  < > 88.8 88.1 88.9  --   PLT 398  < > 397 367 389 496*  < > = values in this interval not displayed.  Lab Results  Component Value Date   TSH 1.98 08/06/2014     Assessment/Plan  Hypothyroidism continue levothyroxine 75 mcg daily. Stable tsh  Tobacco use Continues to smoke, encouraged to quit smoking with her hx of PAD, not willing at present  Constipation Stable, continue miralax daily  HTN bp on soft side of normal today. On review SBP 111-119 and DBP 69-77. Decrease lisinopril to 5 mg daily for now and monitor bp daily for 2 weeks  Oneal GroutMAHIMA Manly Nestle, MD  [facility] [phone] (Monday-Friday 8 am - 5 pm) [phone] (afterhours)     Goals of care: long term care   Labs/tests ordered: cbc with diff, cmp  Family/ staff Communication: reviewed care plan with patient and nursing supervisor    Oneal GroutMAHIMA Lidwina Kaner,  MD  [facility] [phone] (Monday-Friday 8 am - 5 pm) [phone] (afterhours)

## 2014-10-18 LAB — CBC AND DIFFERENTIAL
HEMATOCRIT: 36 % (ref 36–46)
HEMOGLOBIN: 11.9 g/dL — AB (ref 12.0–16.0)
PLATELETS: 388 10*3/uL (ref 150–399)
WBC: 11.8 10*3/mL

## 2014-10-18 LAB — HEPATIC FUNCTION PANEL
ALT: 10 U/L (ref 7–35)
AST: 10 U/L — AB (ref 13–35)
Alkaline Phosphatase: 73 U/L (ref 25–125)
Bilirubin, Total: 0.4 mg/dL

## 2014-10-18 LAB — BASIC METABOLIC PANEL
BUN: 10 mg/dL (ref 4–21)
Creatinine: 0.6 mg/dL (ref 0.5–1.1)
Glucose: 94 mg/dL
Potassium: 4.3 mmol/L (ref 3.4–5.3)
SODIUM: 139 mmol/L (ref 137–147)

## 2014-10-28 ENCOUNTER — Non-Acute Institutional Stay (SKILLED_NURSING_FACILITY): Payer: Medicare Other | Admitting: Internal Medicine

## 2014-10-28 DIAGNOSIS — I739 Peripheral vascular disease, unspecified: Secondary | ICD-10-CM

## 2014-10-28 DIAGNOSIS — I952 Hypotension due to drugs: Secondary | ICD-10-CM

## 2014-10-28 DIAGNOSIS — E46 Unspecified protein-calorie malnutrition: Secondary | ICD-10-CM

## 2014-10-28 DIAGNOSIS — I10 Essential (primary) hypertension: Secondary | ICD-10-CM

## 2014-10-28 DIAGNOSIS — Z8673 Personal history of transient ischemic attack (TIA), and cerebral infarction without residual deficits: Secondary | ICD-10-CM | POA: Insufficient documentation

## 2014-10-28 NOTE — Progress Notes (Signed)
Patient ID: Suzanne Rich, female   DOB: Dec 30, 1940, 74 y.o.   MRN: [id number]      [facility]  Code Status: DNR  No Known Allergies   Chief Complaint  Patient presents with  . New Admit To SNF    HPI:  74 year old patient is seen for routine visit. She is lying in her bed and in no distress. She denies any concerns. Continues to smoke but has cut down to 1 cigarette every other day. On review has few low bp reading. Reviewed her medication. She is tolerating remeron which was introduced recently well. Her appetite has improved. She has history of stroke with left-sided hemiparesis, ongoing tobacco abuse and COPD, hypertension, PAD with recent right AKA, CKD and hyperlipidemia. No new concern from staff.  Review of Systems:  Constitutional: Negative for fever, chills, diaphoresis.  HENT: Negative for headache, congestion, nasal discharge Eyes: Negative for eye pain, blurred vision, double vision and discharge.  Respiratory: Negative for cough, shortness of breath and wheezing.   Cardiovascular: Negative for chest pain, palpitations, leg swelling.  Gastrointestinal: Negative for heartburn, nausea, vomiting, abdominal pain Genitourinary: Negative for dysuria Musculoskeletal: Negative for back pain, falls Skin: Negative for itching, rash.  Neurological: Negative for dizziness, tingling Psychiatric/Behavioral: Negative for depression   Past Medical History  Diagnosis Date  . Hypertension   . Stroke   . COPD (chronic obstructive pulmonary disease)   . Hypothyroidism   . Hyperlipidemia   . Edema   . CHF (congestive heart failure)   . Gout   . OP (osteoporosis)      Family History  Problem Relation Age of Onset  . Colon cancer Mother   . Ovarian cancer Mother   . Emphysema Sister   . Heart failure Daughter     Medications: Patient's Medications  New Prescriptions   No medications on file  Previous Medications   ACETAMINOPHEN (TYLENOL) 650  MG CR TABLET    Take 650 mg by mouth every 8 (eight) hours as needed for pain.   AMBULATORY NON FORMULARY MEDICATION    Medication Name: Med Pass 90 mL four times daily for nutritional support   ASPIRIN 81 MG TABLET    Take 81 mg by mouth daily.   CALCIUM-VITAMIN D (OSCAL WITH D) 500-200 MG-UNIT PER TABLET    Take 1 tablet by mouth 3 (three) times daily.   CHOLECALCIFEROL (VITAMIN D3) 1000 UNITS CAPS    Take 2,000 Units by mouth daily. 2 by mouth daily   FLUTICASONE-SALMETEROL (ADVAIR DISKUS) 100-50 MCG/DOSE AEPB    Inhale 1 puff into the lungs 2 (two) times daily.   LEVOTHYROXINE (SYNTHROID, LEVOTHROID) 75 MCG TABLET    Take 75 mcg by mouth daily before breakfast. For Hypothyroidism   LISINOPRIL (PRINIVIL,ZESTRIL) 10 MG TABLET    Take 1 tablet (10 mg total) by mouth daily. For HTN   METOPROLOL SUCCINATE (TOPROL-XL) 100 MG 24 HR TABLET    Take 100 mg by mouth daily. Take with or immediately following a meal.   POLYETHYLENE GLYCOL (MIRALAX) PACKET    Take 17 g by mouth daily.   SIMVASTATIN (ZOCOR) 10 MG TABLET    Take 10 mg by mouth daily. For Hyperlipidemia   SPIRONOLACTONE (ALDACTONE) 25 MG TABLET    Take 25 mg by mouth daily.  Modified Medications   No medications on file  Discontinued Medications   No medications on file     Physical Exam:  BP 97/61 mmHg  Pulse 64  Temp(Src) 97 F (36.1 C)  Resp 18  SpO2 94%  General- elderly female in no acute distress Head- atraumatic, normocephalic Eyes- PERRLA, EOMI, no pallor, no icterus, no discharge Neck- no lymphadenopathy, no jvd Mouth- normal mucus membrane, adentulous Cardiovascular- normal s1,s2, no murmurs, weak left dorsalis pedis Respiratory- bilateral clear to auscultation, no wheeze, no rhonchi, no crackles Abdomen- bowel sounds present, soft, non tender Musculoskeletal- able to move right UE. Right AKA. Left sided hemiparesis LUE>LLE. Trace left leg edema.  Neurological- no focal deficit Skin- warm and dry Psychiatry- alert  and oriented, normal mood and affect   Labs reviewed: Basic Metabolic Panel:  Recent Labs  16/10/96 1032 07/16/14 0330 07/17/14 0436 07/21/14  NA 130* 130* 128* 130*  K 4.4 4.5 4.2 5.0  CL 97* 100* 99*  --   CO2 24 21* 22  --   GLUCOSE 125* 107* 95  --   BUN 6 8 6 12   CREATININE 0.78 0.70 0.63 0.7  CALCIUM 9.6 9.6 9.2  --    Liver Function Tests:  Recent Labs  07/09/14 07/10/14 0434  AST 21 24  ALT 15 18  ALKPHOS 83 77  BILITOT  --  0.7  PROT  --  6.2*  ALBUMIN  --  3.1*   No results for input(s): LIPASE, AMYLASE in the last 8760 hours. No results for input(s): AMMONIA in the last 8760 hours. CBC:  Recent Labs  07/09/14 2130  07/15/14 1032 07/16/14 0330 07/17/14 0436 07/21/14  WBC 11.2*  < > 9.4 11.8* 11.8* 9.9  NEUTROABS 8.0*  --   --   --   --   --   HGB 12.7  < > 13.1 11.8* 11.8* 12.8  HCT 36.4  < > 37.9 34.7* 33.6* 38  MCV 88.1  < > 88.8 88.1 88.9  --   PLT 398  < > 397 367 389 496*  < > = values in this interval not displayed.  Lab Results  Component Value Date   TSH 1.98 08/06/2014   Lipid Panel     Component Value Date/Time   CHOL 128 06/13/2014   TRIG 66 06/13/2014   HDL 66 06/13/2014   LDLCALC 49 06/13/2014     Assessment/Plan  Protein calorie malnutrition Weight has been stable, appetite has improved. Continue fortified food and medpass supplement. cotinue remeron  Hypotension bp remains on lower side. D/c lisinopril for now. Monitor bp reading and adjust further as needed. Continue toprol xl 100 mg daily  HTN Low reading, continue toprol xl and aldactone and monitor  PVD S/p right AKA. Continue baby aspirin, tylenol and tramadol on as needed basis for pain and counselling on smoking cessation provided  Chronic cerebral infarction With left sided hemiplegia. Continue baby aspirin and bp medication, changes made as above. continue statin   Oneal Grout, MD  [facility] [phone] (Monday-Friday 8 am - 5  pm) [phone] (afterhours)

## 2014-11-27 ENCOUNTER — Non-Acute Institutional Stay (SKILLED_NURSING_FACILITY): Payer: Medicare Other | Admitting: Internal Medicine

## 2014-11-27 DIAGNOSIS — K5901 Slow transit constipation: Secondary | ICD-10-CM | POA: Diagnosis not present

## 2014-11-27 DIAGNOSIS — E46 Unspecified protein-calorie malnutrition: Secondary | ICD-10-CM

## 2014-11-27 DIAGNOSIS — I10 Essential (primary) hypertension: Secondary | ICD-10-CM

## 2014-11-27 NOTE — Progress Notes (Signed)
Patient ID: Suzanne Rich, female   DOB: Apr 11, 1940, 74 y.o.   MRN: [id number]      [facility]  Code Status: DNR  No Known Allergies   Chief Complaint  Patient presents with  . Medical Management of Chronic Issues    Medical Management of Chronic Issues  . OTHER    [facility]    HPI:  74 year old patient is seen for routine visit. She has been at her baseline. She was started on lexapro with concerns for depression but mentions that her mood has been stable. Denies any crying episode. No behavioral changes noted by nursing staff. She is lying in her bed and in no distress. Off lisinopril with low bp reading. Currently bp stable.  She has history of stroke with left-sided hemiparesis, ongoing tobacco abuse and COPD, hypertension, PAD s/p right AKA, CKD and hyperlipidemia. No new concern from staff.  Review of Systems:  Constitutional: Negative for fever, chills, diaphoresis.  HENT: Negative for headache, congestion, nasal discharge Eyes: Negative for eye pain, blurred vision, double vision and discharge.  Respiratory: Negative for cough, shortness of breath and wheezing.   Cardiovascular: Negative for chest pain, palpitations, leg swelling.  Gastrointestinal: Negative for heartburn, nausea, vomiting, abdominal pain Genitourinary: Negative for dysuria Musculoskeletal: Negative for back pain, falls Skin: Negative for itching, rash.  Neurological: Negative for dizziness, tingling Psychiatric/Behavioral: Negative for depression   Past Medical History  Diagnosis Date  . Hypertension   . Stroke   . COPD (chronic obstructive pulmonary disease)   . Hypothyroidism   . Hyperlipidemia   . Edema   . CHF (congestive heart failure)   . Gout   . OP (osteoporosis)      Family History  Problem Relation Age of Onset  . Colon cancer Mother   . Ovarian cancer Mother   . Emphysema Sister   . Heart failure Daughter     Medications: Patient's Medications  New  Prescriptions   No medications on file  Previous Medications   ACETAMINOPHEN (TYLENOL) 650 MG CR TABLET    Take 650 mg by mouth every 8 (eight) hours as needed for pain.   AMBULATORY NON FORMULARY MEDICATION    Medication Name: Med Pass 120 mL four times daily for nutritional support   ASPIRIN 81 MG TABLET    Take 81 mg by mouth daily.   CALCIUM-VITAMIN D (OSCAL WITH D) 500-200 MG-UNIT PER TABLET    Take 1 tablet by mouth 3 (three) times daily.   CHOLECALCIFEROL (VITAMIN D3) 1000 UNITS CAPS    Take 2 tablets by mouth daily   ESCITALOPRAM (LEXAPRO) 5 MG TABLET    Take one tablet by mouth every morning for depression   FLUTICASONE-SALMETEROL (ADVAIR DISKUS) 100-50 MCG/DOSE AEPB    Inhale 1 puff into the lungs 2 (two) times daily.   LEVOTHYROXINE (SYNTHROID, LEVOTHROID) 75 MCG TABLET    Take 75 mcg by mouth daily before breakfast. For Hypothyroidism   METOPROLOL SUCCINATE (TOPROL-XL) 100 MG 24 HR TABLET    Take 100 mg by mouth daily. Take with or immediately following a meal.   MIRTAZAPINE (REMERON) 7.5 MG TABLET    Take one tablet by mouth at bedtime for rest/depression   POLYETHYLENE GLYCOL (MIRALAX) PACKET    Take 17 g by mouth daily.   PROTEIN (PROCEL PO)    Take one scoop by mouth twice daily for protein   SIMVASTATIN (ZOCOR) 10 MG TABLET    Take 10 mg  by mouth daily. For Hyperlipidemia   SPIRONOLACTONE (ALDACTONE) 25 MG TABLET    Take 25 mg by mouth daily.  Modified Medications   No medications on file  Discontinued Medications   LISINOPRIL (PRINIVIL,ZESTRIL) 10 MG TABLET    Take 1 tablet (10 mg total) by mouth daily. For HTN     Physical Exam:  BP 109/61 mmHg  Pulse 63  Temp(Src) 97.7 F (36.5 C) (Oral)  Resp 20  Ht  (1.6 m)  Wt 144 lb 4.8 oz (65.454 kg)  BMI 25.57 kg/m2  SpO2 96%  Wt Readings from Last 3 Encounters:  11/27/14 144 lb 4.8 oz (65.454 kg)  08/14/14 144 lb 3.2 oz (65.409 kg)  08/01/14 157 lb (71.215 kg)   General- elderly female in no acute  distress Head- atraumatic, normocephalic Eyes- PERRLA, EOMI, no pallor, no icterus, no discharge Neck- no lymphadenopathy, no jvd Mouth- normal mucus membrane, adentulous Cardiovascular- normal s1,s2, no murmurs, weak left dorsalis pedis Respiratory- bilateral clear to auscultation, no wheeze, no rhonchi, no crackles Abdomen- bowel sounds present, soft, non tender Musculoskeletal- able to move right UE. Right AKA. Left sided hemiparesis LUE>LLE. Trace left leg edema.  Neurological- no focal deficit Skin- warm and dry Psychiatry- alert and oriented, normal mood and affect   Labs reviewed: Basic Metabolic Panel:  Recent Labs  16/10/96 1032 07/16/14 0330 07/17/14 0436 07/21/14 10/18/14  NA 130* 130* 128* 130* 139  K 4.4 4.5 4.2 5.0 4.3  CL 97* 100* 99*  --   --   CO2 24 21* 22  --   --   GLUCOSE 125* 107* 95  --   --   BUN CREATININE 0.78 0.70 0.63 0.7 0.6  CALCIUM 9.6 9.6 9.2  --   --    Liver Function Tests:  Recent Labs  07/09/14 07/10/14 0434 10/18/14  AST 21 24 10*  ALT ALKPHOS 83 77 73  BILITOT  --  0.7  --   PROT  --  6.2*  --   ALBUMIN  --  3.1*  --    No results for input(s): LIPASE, AMYLASE in the last 8760 hours. No results for input(s): AMMONIA in the last 8760 hours. CBC:  Recent Labs  07/09/14 2130  07/15/14 1032 07/16/14 0330 07/17/14 0436 07/21/14 10/18/14  WBC 11.2*  < > 9.4 11.8* 11.8* 9.9 11.8  NEUTROABS 8.0*  --   --   --   --   --   --   HGB 12.7  < > 13.1 11.8* 11.8* 12.8 11.9*  HCT 36.4  < > 37.9 34.7* 33.6* 38 36  MCV 88.1  < > 88.8 88.1 88.9  --   --   PLT 398  < > 397 367 389 496* 388  < > = values in this interval not displayed.  Lab Results  Component Value Date   TSH 1.98 08/06/2014   Lipid Panel     Component Value Date/Time   CHOL 128 06/13/2014   TRIG 66 06/13/2014   HDL 66 06/13/2014   LDLCALC 49 06/13/2014     Assessment/Plan  Hypertension Off lisinopril with low bp reading, bp stable  now, monitor readings periodically  Constipation Stable, continue miralax daily  Protein calorie malnutrition Wt Readings from Last 3 Encounters:  11/27/14 144 lb 4.8 oz (65.454 kg)  08/14/14 144 lb 3.2 oz (65.409 kg)  08/01/14 157 lb (71.215 kg)  stable weight. Continue procel  and remeron with fortified food and monitor weight  D/c lexapro as mood is stable   Oneal Grout, MD  [facility] [phone] (Monday-Friday 8 am - 5 pm) [phone] (afterhours)

## 2014-12-02 LAB — BASIC METABOLIC PANEL
BUN: 11 mg/dL (ref 4–21)
Creatinine: 0.6 mg/dL (ref 0.5–1.1)
GLUCOSE: 83 mg/dL
POTASSIUM: 3.9 mmol/L (ref 3.4–5.3)
Sodium: 142 mmol/L (ref 137–147)

## 2014-12-02 LAB — CBC AND DIFFERENTIAL
HCT: 36 % (ref 36–46)
HEMOGLOBIN: 11.6 g/dL — AB (ref 12.0–16.0)
Platelets: 348 10*3/uL (ref 150–399)
WBC: 10.7 10*3/mL

## 2014-12-02 LAB — LIPID PANEL
Cholesterol: 123 mg/dL (ref 0–200)
HDL: 42 mg/dL (ref 35–70)
LDL CALC: 66 mg/dL
Triglycerides: 77 mg/dL (ref 40–160)

## 2014-12-02 LAB — HEPATIC FUNCTION PANEL
ALT: 7 U/L (ref 7–35)
AST: 10 U/L — AB (ref 13–35)
Alkaline Phosphatase: 87 U/L (ref 25–125)
Bilirubin, Total: 0.4 mg/dL

## 2014-12-02 LAB — TSH: TSH: 1.37 u[IU]/mL (ref 0.41–5.90)

## 2014-12-23 ENCOUNTER — Non-Acute Institutional Stay (SKILLED_NURSING_FACILITY): Payer: Medicare Other | Admitting: Internal Medicine

## 2014-12-23 ENCOUNTER — Encounter: Payer: Self-pay | Admitting: Internal Medicine

## 2014-12-23 DIAGNOSIS — E038 Other specified hypothyroidism: Secondary | ICD-10-CM

## 2014-12-23 DIAGNOSIS — I10 Essential (primary) hypertension: Secondary | ICD-10-CM

## 2014-12-23 DIAGNOSIS — E46 Unspecified protein-calorie malnutrition: Secondary | ICD-10-CM | POA: Diagnosis not present

## 2014-12-23 DIAGNOSIS — J439 Emphysema, unspecified: Secondary | ICD-10-CM

## 2014-12-23 NOTE — Progress Notes (Signed)
Patient ID: Suzanne Rich, female   DOB: 1940-03-18, 74 y.o.   MRN: [id number]      [facility]  Code Status: DNR  No Known Allergies   Chief Complaint  Patient presents with  . Medical Management of Chronic Issues    Routine visit     HPI:  74 year old patient is seen for routine visit. She denies any concerns this visit and she has been at her baseline. She has history of stroke with left-sided hemiparesis, ongoing tobacco abuse and COPD, hypertension, PAD s/p right AKA, CKD and hyperlipidemia. No new concern from staff. No falls reported. Mood has been good. No new skin concerns. Received flu vaccine on 12/12/14. bp reading has been on lower side of normal on review. Losing weight and weight now low but stable.  Review of Systems:  Constitutional: Negative for fever, chills, diaphoresis.  HENT: Negative for headache, congestion, nasal discharge Eyes: Negative for eye pain, blurred vision, double vision and discharge.  Respiratory: Negative for cough, shortness of breath and wheezing.   Cardiovascular: Negative for chest pain, palpitations, leg swelling.  Gastrointestinal: Negative for heartburn, nausea, vomiting, abdominal pain Genitourinary: Negative for dysuria Musculoskeletal: Negative for back pain, falls Skin: Negative for itching, rash.  Neurological: Negative for dizziness, tingling Psychiatric/Behavioral: Negative for depression   Past Medical History  Diagnosis Date  . Hypertension   . Stroke (HCC)   . COPD (chronic obstructive pulmonary disease) (HCC)   . Hypothyroidism   . Hyperlipidemia   . Edema   . CHF (congestive heart failure) (HCC)   . Gout   . OP (osteoporosis)     Medications: Patient's Medications  New Prescriptions   No medications on file  Previous Medications   ACETAMINOPHEN (TYLENOL) 650 MG CR TABLET    Take 650 mg by mouth every 8 (eight) hours as needed for pain.   AMBULATORY NON FORMULARY MEDICATION     Medication Name: Med Pass 120 mL four times daily for nutritional support   ASPIRIN 81 MG TABLET    Take 81 mg by mouth daily.   CALCIUM-VITAMIN D (OSCAL WITH D) 500-200 MG-UNIT PER TABLET    Take 1 tablet by mouth 3 (three) times daily.   CHOLECALCIFEROL (VITAMIN D3) 1000 UNITS CAPS    Take 2 tablets by mouth daily   FLUTICASONE-SALMETEROL (ADVAIR DISKUS) 100-50 MCG/DOSE AEPB    Inhale 1 puff into the lungs 2 (two) times daily.   LEVOTHYROXINE (SYNTHROID, LEVOTHROID) 75 MCG TABLET    Take 75 mcg by mouth daily before breakfast. For Hypothyroidism   METOPROLOL SUCCINATE (TOPROL-XL) 100 MG 24 HR TABLET    Take 100 mg by mouth daily. Take with or immediately following a meal.   MIRTAZAPINE (REMERON) 7.5 MG TABLET    Take one tablet by mouth at bedtime for rest/depression   POLYETHYLENE GLYCOL (MIRALAX) PACKET    Take 17 g by mouth daily.   PROTEIN (PROCEL PO)    Take one scoop by mouth twice daily for protein   SIMVASTATIN (ZOCOR) 10 MG TABLET    Take 10 mg by mouth daily. For Hyperlipidemia   SPIRONOLACTONE (ALDACTONE) 25 MG TABLET    Take 25 mg by mouth daily.  Modified Medications   No medications on file  Discontinued Medications   ESCITALOPRAM (LEXAPRO) 5 MG TABLET    Take one tablet by mouth every morning for depression     Physical Exam:  BP 107/55 mmHg  Pulse 61  Temp(Src) 96.2 F (35.7 C) (Oral)  Resp 20  Wt 129 lb 12.8 oz (58.877 kg)  SpO2 96%  Wt Readings from Last 3 Encounters:  12/23/14 129 lb 12.8 oz (58.877 kg)  11/27/14 144 lb 4.8 oz (65.454 kg)  08/14/14 144 lb 3.2 oz (65.409 kg)   General- elderly female in no acute distress Head- atraumatic, normocephalic Eyes- PERRLA, EOMI, no pallor, no icterus, no discharge Neck- no lymphadenopathy, no jvd Mouth- normal mucus membrane, adentulous Cardiovascular- normal s1,s2, no murmurs, weak left dorsalis pedis Respiratory- bilateral clear to auscultation, no wheeze, no rhonchi, no crackles Abdomen- bowel sounds present,  soft, non tender Musculoskeletal- able to move right UE. Right AKA. Left sided hemiparesis LUE>LLE. Trace left leg edema.  Neurological- no focal deficit Skin- warm and dry Psychiatry- alert and oriented, normal mood and affect   Labs reviewed: Basic Metabolic Panel:  Recent Labs  16/10/96 1032 07/16/14 0330 07/17/14 0436 07/21/14 10/18/14  NA 130* 130* 128* 130* 139  K 4.4 4.5 4.2 5.0 4.3  CL 97* 100* 99*  --   --   CO2 24 21* 22  --   --   GLUCOSE 125* 107* 95  --   --   BUN CREATININE 0.78 0.70 0.63 0.7 0.6  CALCIUM 9.6 9.6 9.2  --   --    Liver Function Tests:  Recent Labs  07/09/14 07/10/14 0434 10/18/14  AST 21 24 10*  ALT ALKPHOS 83 77 73  BILITOT  --  0.7  --   PROT  --  6.2*  --   ALBUMIN  --  3.1*  --    No results for input(s): LIPASE, AMYLASE in the last 8760 hours. No results for input(s): AMMONIA in the last 8760 hours. CBC:  Recent Labs  07/09/14 2130  07/15/14 1032 07/16/14 0330 07/17/14 0436 07/21/14 10/18/14  WBC 11.2*  < > 9.4 11.8* 11.8* 9.9 11.8  NEUTROABS 8.0*  --   --   --   --   --   --   HGB 12.7  < > 13.1 11.8* 11.8* 12.8 11.9*  HCT 36.4  < > 37.9 34.7* 33.6* 38 36  MCV 88.1  < > 88.8 88.1 88.9  --   --   PLT 398  < > 397 367 389 496* 388  < > = values in this interval not displayed.  Lab Results  Component Value Date   TSH 1.98 08/06/2014   Lipid Panel     Component Value Date/Time   CHOL 128 06/13/2014   TRIG 66 06/13/2014   HDL 66 06/13/2014   LDLCALC 49 06/13/2014    Assessment/Plan  Hypertension bp remains on lower side. Currently on toprol xl 100 mg daily and aldactone 25 mg daily. Discontinue aldactone. Monitor bp bid for 1 week, then daily and adjust medication if bp remains elevated  Hypothyroidism Continue levothyroxine 75 mcg daily, reviewed tsh from 6/16  Protein calorie malnutrition Wt Readings from Last 3 Encounters:  12/23/14 129 lb 12.8 oz (58.877 kg)  11/27/14 144 lb 4.8 oz  (65.454 kg)  08/14/14 144 lb 3.2 oz (65.409 kg)  low but stable weight. Continue procel and remeron with fortified food and monitor weight  Copd Breathing stable, continue her advair, continues to smoke  Oneal Grout, MD  [facility] [phone] (Monday-Friday 8 am - 5 pm) [phone] (afterhours)

## 2015-02-03 ENCOUNTER — Non-Acute Institutional Stay (SKILLED_NURSING_FACILITY): Payer: Medicare Other | Admitting: Internal Medicine

## 2015-02-03 ENCOUNTER — Encounter: Payer: Self-pay | Admitting: Internal Medicine

## 2015-02-03 DIAGNOSIS — E038 Other specified hypothyroidism: Secondary | ICD-10-CM | POA: Diagnosis not present

## 2015-02-03 DIAGNOSIS — M810 Age-related osteoporosis without current pathological fracture: Secondary | ICD-10-CM | POA: Diagnosis not present

## 2015-02-03 DIAGNOSIS — F17200 Nicotine dependence, unspecified, uncomplicated: Secondary | ICD-10-CM | POA: Diagnosis not present

## 2015-02-03 NOTE — Progress Notes (Signed)
Patient ID: Suzanne Rich, female   DOB: 07/10/1940, 74 y.o.   MRN: [id number]      [facility]  Code Status: DNR  No Known Allergies   Chief Complaint  Patient presents with  . Medical Management of Chronic Issues    Routine Visit     HPI:  74 year old patient is seen for routine visit. No new concern from patient and staff this visit.  Review of Systems:  Constitutional: Negative for fever, chills, diaphoresis.  HENT: Negative for headache, congestion, nasal discharge Eyes: Negative for double vision and discharge.  Respiratory: Negative for cough, shortness of breath and wheezing.   Cardiovascular: Negative for chest pain  Gastrointestinal: Negative for heartburn, nausea, vomiting, abdominal pain Genitourinary: Negative for dysuria   Past Medical History  Diagnosis Date  . Hypertension   . Stroke (HCC)   . COPD (chronic obstructive pulmonary disease) (HCC)   . Hypothyroidism   . Hyperlipidemia   . Edema   . CHF (congestive heart failure) (HCC)   . Gout   . OP (osteoporosis)     Medications: Patient's Medications  New Prescriptions   No medications on file  Previous Medications   ACETAMINOPHEN (TYLENOL) 650 MG CR TABLET    Take 650 mg by mouth every 8 (eight) hours as needed for pain.   AMBULATORY NON FORMULARY MEDICATION    Medication Name: Med Pass 120 mL four times daily for nutritional support   ASPIRIN 81 MG TABLET    Take 81 mg by mouth daily.   CALCIUM-VITAMIN D (OSCAL WITH D) 500-200 MG-UNIT PER TABLET    Take 1 tablet by mouth 3 (three) times daily.   CHOLECALCIFEROL (VITAMIN D3) 1000 UNITS CAPS    Take 2 tablets by mouth daily   FLUTICASONE-SALMETEROL (ADVAIR DISKUS) 100-50 MCG/DOSE AEPB    Inhale 1 puff into the lungs 2 (two) times daily.   LEVOTHYROXINE (SYNTHROID, LEVOTHROID) 75 MCG TABLET    Take 75 mcg by mouth daily before breakfast. For Hypothyroidism   METOPROLOL SUCCINATE (TOPROL-XL) 100 MG 24 HR TABLET    Take 100 mg  by mouth daily. Take with or immediately following a meal.   MIRTAZAPINE (REMERON) 7.5 MG TABLET    Take one tablet by mouth at bedtime for rest/depression   POLYETHYLENE GLYCOL (MIRALAX) PACKET    Take 17 g by mouth daily.   PROTEIN (PROCEL PO)    Take one scoop by mouth twice daily for protein   SIMVASTATIN (ZOCOR) 10 MG TABLET    Take 10 mg by mouth daily. For Hyperlipidemia   SPIRONOLACTONE (ALDACTONE) 25 MG TABLET    Take 25 mg by mouth daily.   UNABLE TO FIND    Med Name: Magic Cup twice daily with med pass @@2  pm and 8 pm  Modified Medications   No medications on file  Discontinued Medications   No medications on file     Physical Exam:  BP 122/59 mmHg  Pulse 68  Temp(Src) 99 F (37.2 C) (Oral)  Resp 18  SpO2 96%  Wt Readings from Last 3 Encounters:  12/23/14 129 lb 12.8 oz (58.877 kg)  11/27/14 144 lb 4.8 oz (65.454 kg)  08/14/14 144 lb 3.2 oz (65.409 kg)   General- elderly female in no acute distress Head- atraumatic, normocephalic Eyes- PERRLA, EOMI, no pallor, no icterus, no discharge Neck- no lymphadenopathy, no jvd Mouth- normal mucus membrane, adentulous Cardiovascular- normal s1,s2, no murmurs, weak left dorsalis pedis Respiratory-  bilateral clear to auscultation, no wheeze, no rhonchi, no crackles Abdomen- bowel sounds present, soft, non tender Musculoskeletal- able to move right UE. Right AKA. Left sided hemiparesis LUE>LLE. Trace left leg edema.  Neurological- no focal deficit Skin- warm and dry Psychiatry- alert and oriented, normal mood and affect   Labs reviewed: Basic Metabolic Panel:  Recent Labs  82/95/6205/16/16 1032 07/16/14 0330 07/17/14 0436 07/21/14 10/18/14  NA 130* 130* 128* 130* 139  K 4.4 4.5 4.2 5.0 4.3  CL 97* 100* 99*  --   --   CO2 24 21* 22  --   --   GLUCOSE 125* 107* 95  --   --   BUN 6 8 6 12 10   CREATININE 0.78 0.70 0.63 0.7 0.6  CALCIUM 9.6 9.6 9.2  --   --    Liver Function Tests:  Recent Labs  07/09/14 07/10/14 0434  10/18/14  AST 21 24 10*  ALT 15 18 10   ALKPHOS 83 77 73  BILITOT  --  0.7  --   PROT  --  6.2*  --   ALBUMIN  --  3.1*  --    No results for input(s): LIPASE, AMYLASE in the last 8760 hours. No results for input(s): AMMONIA in the last 8760 hours. CBC:  Recent Labs  07/09/14 2130  07/15/14 1032 07/16/14 0330 07/17/14 0436 07/21/14 10/18/14  WBC 11.2*  < > 9.4 11.8* 11.8* 9.9 11.8  NEUTROABS 8.0*  --   --   --   --   --   --   HGB 12.7  < > 13.1 11.8* 11.8* 12.8 11.9*  HCT 36.4  < > 37.9 34.7* 33.6* 38 36  MCV 88.1  < > 88.8 88.1 88.9  --   --   PLT 398  < > 397 367 389 496* 388  < > = values in this interval not displayed.  Lab Results  Component Value Date   TSH 1.98 08/06/2014   Lipid Panel     Component Value Date/Time   CHOL 128 06/13/2014   TRIG 66 06/13/2014   HDL 66 06/13/2014   LDLCALC 49 06/13/2014    Assessment/Plan  Tobacco use counselled about smoking cessation. Continue advair, monitor  osteoporosis With OA. Continue oscal and vitamin d  Hypothyroidism Continue levothyroxine 75 mcg daily, stable   Oneal GroutMAHIMA Statia Burdick, MD  [facility] [phone] (Monday-Friday 8 am - 5 pm) [phone] (afterhours)

## 2015-03-07 ENCOUNTER — Non-Acute Institutional Stay (SKILLED_NURSING_FACILITY): Payer: Medicare Other | Admitting: Internal Medicine

## 2015-03-07 DIAGNOSIS — I10 Essential (primary) hypertension: Secondary | ICD-10-CM

## 2015-03-07 DIAGNOSIS — E46 Unspecified protein-calorie malnutrition: Secondary | ICD-10-CM | POA: Diagnosis not present

## 2015-03-07 DIAGNOSIS — E785 Hyperlipidemia, unspecified: Secondary | ICD-10-CM

## 2015-03-07 DIAGNOSIS — Z8673 Personal history of transient ischemic attack (TIA), and cerebral infarction without residual deficits: Secondary | ICD-10-CM | POA: Diagnosis not present

## 2015-03-07 NOTE — Progress Notes (Signed)
Patient ID: Suzanne Rich, female   DOB: 04-Apr-1940, 75 y.o.   MRN: [id number]     [facility]  Code Status: DNR  No Known Allergies   Chief Complaint  Patient presents with  . Medical Management of Chronic Issues    HPI:  75 year old patient is seen for routine visit. She has history of stroke with left-sided hemiparesis, ongoing tobacco abuse and COPD, hypertension, PAD s/p right AKA, CKD and hyperlipidemia. No new concern from patient or staff. No falls reported. No new skin concerns.   Review of Systems:  Constitutional: Negative for fever.  HENT: Negative for headache, congestion, nasal discharge Eyes: Negative for double vision and discharge.  Respiratory: Negative for cough, shortness of breath and wheezing.   Cardiovascular: Negative for chest pain, palpitations, leg swelling.  Gastrointestinal: Negative for heartburn, nausea, vomiting, abdominal pain Genitourinary: Negative for dysuria Musculoskeletal: Negative for back pain, falls Skin: Negative for itching, rash.  Neurological: Negative for dizziness, tingling Psychiatric/Behavioral: Negative for depression   Past Medical History  Diagnosis Date  . Hypertension   . Stroke (HCC)   . COPD (chronic obstructive pulmonary disease) (HCC)   . Hypothyroidism   . Hyperlipidemia   . Edema   . CHF (congestive heart failure) (HCC)   . Gout   . OP (osteoporosis)     Medications: Patient's Medications  New Prescriptions   No medications on file  Previous Medications   ACETAMINOPHEN (TYLENOL) 650 MG CR TABLET    Take 650 mg by mouth every 8 (eight) hours as needed for pain.   AMBULATORY NON FORMULARY MEDICATION    Medication Name: Med Pass 120 mL four times daily for nutritional support   ASPIRIN 81 MG TABLET    Take 81 mg by mouth daily.   CALCIUM-VITAMIN D (OSCAL WITH D) 500-200 MG-UNIT PER TABLET    Take 1 tablet by mouth 3 (three) times daily.   CHOLECALCIFEROL (VITAMIN D3) 1000 UNITS CAPS     Take 2 tablets by mouth daily   FLUTICASONE-SALMETEROL (ADVAIR DISKUS) 100-50 MCG/DOSE AEPB    Inhale 1 puff into the lungs 2 (two) times daily.   LEVOTHYROXINE (SYNTHROID, LEVOTHROID) 75 MCG TABLET    Take 75 mcg by mouth daily before breakfast. For Hypothyroidism   METOPROLOL SUCCINATE (TOPROL-XL) 100 MG 24 HR TABLET    Take 100 mg by mouth daily. Take with or immediately following a meal.   MIRTAZAPINE (REMERON) 7.5 MG TABLET    Take one tablet by mouth at bedtime for rest/depression   POLYETHYLENE GLYCOL (MIRALAX) PACKET    Take 17 g by mouth daily.   PROTEIN (PROCEL PO)    Take one scoop by mouth twice daily for protein   SIMVASTATIN (ZOCOR) 10 MG TABLET    Take 10 mg by mouth daily. For Hyperlipidemia   SPIRONOLACTONE (ALDACTONE) 25 MG TABLET    Take 25 mg by mouth daily.   UNABLE TO FIND    Med Name: Magic Cup twice daily with med pass @@2  pm and 8 pm  Modified Medications   No medications on file  Discontinued Medications   No medications on file     Physical Exam:  BP 128/60 mmHg  Pulse 61  Temp(Src) 98.7 F (37.1 C)  Resp 18  SpO2 97%  Wt Readings from Last 3 Encounters:  12/23/14 129 lb 12.8 oz (58.877 kg)  11/27/14 144 lb 4.8 oz (65.454 kg)  08/14/14 144 lb 3.2 oz (65.409 kg)  General- elderly female in no acute distress Head- atraumatic, normocephalic Eyes- PERRLA, EOMI, no pallor, no icterus, no discharge Neck- no lymphadenopathy, no jvd Mouth- normal mucus membrane, adentulous Cardiovascular- normal s1,s2, no murmurs Respiratory- bilateral clear to auscultation, no wheeze, no rhonchi, no crackles Abdomen- bowel sounds present, soft, non tender Musculoskeletal- able to move right UE. Right AKA. Left sided hemiparesis LUE>LLE. Trace left leg edema.  Neurological- no focal deficit Skin- warm and dry Psychiatry- alert and oriented, normal mood and affect   Labs reviewed: Basic Metabolic Panel:  Recent Labs  16/10/96 1032 07/16/14 0330 07/17/14 0436  07/21/14 10/18/14  NA 130* 130* 128* 130* 139  K 4.4 4.5 4.2 5.0 4.3  CL 97* 100* 99*  --   --   CO2 24 21* 22  --   --   GLUCOSE 125* 107* 95  --   --   BUN 6 8 6 12 10   CREATININE 0.78 0.70 0.63 0.7 0.6  CALCIUM 9.6 9.6 9.2  --   --    Liver Function Tests:  Recent Labs  07/09/14 07/10/14 0434 10/18/14  AST 21 24 10*  ALT 15 18 10   ALKPHOS 83 77 73  BILITOT  --  0.7  --   PROT  --  6.2*  --   ALBUMIN  --  3.1*  --    No results for input(s): LIPASE, AMYLASE in the last 8760 hours. No results for input(s): AMMONIA in the last 8760 hours. CBC:  Recent Labs  07/09/14 2130  07/15/14 1032 07/16/14 0330 07/17/14 0436 07/21/14 10/18/14  WBC 11.2*  < > 9.4 11.8* 11.8* 9.9 11.8  NEUTROABS 8.0*  --   --   --   --   --   --   HGB 12.7  < > 13.1 11.8* 11.8* 12.8 11.9*  HCT 36.4  < > 37.9 34.7* 33.6* 38 36  MCV 88.1  < > 88.8 88.1 88.9  --   --   PLT 398  < > 397 367 389 496* 388  < > = values in this interval not displayed.  Lab Results  Component Value Date   TSH 1.98 08/06/2014   Lipid Panel     Component Value Date/Time   CHOL 128 06/13/2014   TRIG 66 06/13/2014   HDL 66 06/13/2014   LDLCALC 49 06/13/2014   12/02/14 wbc 10.7, hb 11.6, plt 348, na 142, k 3.9, bun 11, cr 0.62, alb 3.35, lft wnl, t.chol 123, hdl 41, ldl 66, tg 77, tsh 1.374, prealbumin 13  Assessment/Plan  Hyperlipidemia ldl at goal, hx of cva, continue zocor 10 mg daily  cva with left sided hemiplegia Continue baby aspirin and statin  Protein caloire malnutrition Continue fortified meals with nutritional supplement and monitor weight  Hypertension bp rside. Currently on toprol xl 100 mg daily . Monitor BP    Tallin Hart, MD  [facility] [phone] (Monday-Friday 8 am - 5 pm) [phone] (afterhours)        Cbc, cmp, tsh, lipid, prealbumin

## 2015-04-07 ENCOUNTER — Encounter: Payer: Self-pay | Admitting: Internal Medicine

## 2015-04-07 ENCOUNTER — Non-Acute Institutional Stay (SKILLED_NURSING_FACILITY): Payer: Medicare Other | Admitting: Internal Medicine

## 2015-04-07 DIAGNOSIS — I10 Essential (primary) hypertension: Secondary | ICD-10-CM

## 2015-04-07 DIAGNOSIS — I739 Peripheral vascular disease, unspecified: Secondary | ICD-10-CM

## 2015-04-07 DIAGNOSIS — K5900 Constipation, unspecified: Secondary | ICD-10-CM | POA: Diagnosis not present

## 2015-04-07 DIAGNOSIS — M810 Age-related osteoporosis without current pathological fracture: Secondary | ICD-10-CM

## 2015-04-07 DIAGNOSIS — J439 Emphysema, unspecified: Secondary | ICD-10-CM | POA: Diagnosis not present

## 2015-04-07 DIAGNOSIS — K5909 Other constipation: Secondary | ICD-10-CM

## 2015-04-07 DIAGNOSIS — G8194 Hemiplegia, unspecified affecting left nondominant side: Secondary | ICD-10-CM | POA: Insufficient documentation

## 2015-04-07 NOTE — Progress Notes (Signed)
Patient ID: Suzanne Rich, female   DOB: 10/26/1940, 75 y.o.   MRN: [id number]     [facility]  Code Status: DNR  No Known Allergies   Chief Complaint  Patient presents with  . Medical Management of Chronic Issues    Routine Visit    HPI:  75 year old patient is seen for routine visit. She has been at her baseline. appetite is good. Denies any fall. No pressure ulcer reported by staff. No behavioral changes reported. She has history of stroke with left-sided hemiparesis, ongoing tobacco use and COPD, hypertension, PAD s/p right AKA, CKD and hyperlipidemia.    Review of Systems:  Constitutional: Negative for fever.  HENT: Negative for headache, congestion, nasal discharge Eyes: Negative for double vision and discharge.  Respiratory: Negative for cough, shortness of breath and wheezing.   Cardiovascular: Negative for chest pain, palpitations, leg swelling.  Gastrointestinal: Negative for heartburn, nausea, vomiting, abdominal pain Genitourinary: Negative for dysuria Musculoskeletal: Negative for back pain, falls Skin: Negative for itching, rash.  Neurological: Negative for dizziness, tingling Psychiatric/Behavioral: Negative for depression   Past Medical History  Diagnosis Date  . Hypertension   . Stroke (HCC)   . COPD (chronic obstructive pulmonary disease) (HCC)   . Hypothyroidism   . Hyperlipidemia   . Edema   . CHF (congestive heart failure) (HCC)   . Gout   . OP (osteoporosis)     Medications: Patient's Medications  New Prescriptions   No medications on file  Previous Medications   ACETAMINOPHEN (TYLENOL) 650 MG CR TABLET    Take 650 mg by mouth every 8 (eight) hours as needed for pain.   AMBULATORY NON FORMULARY MEDICATION    Medication Name: Med Pass 120 mL four times daily for nutritional support   ASPIRIN 81 MG TABLET    Take 81 mg by mouth daily.   CALCIUM-VITAMIN D (OSCAL WITH D) 500-200 MG-UNIT PER TABLET    Take 1 tablet by  mouth 3 (three) times daily.   CHOLECALCIFEROL (VITAMIN D3) 1000 UNITS CAPS    Take 2,000 capsules by mouth daily. Take 1 capsule daily   FLUTICASONE-SALMETEROL (ADVAIR DISKUS) 100-50 MCG/DOSE AEPB    Inhale 1 puff into the lungs 2 (two) times daily.   LEVOTHYROXINE (SYNTHROID, LEVOTHROID) 75 MCG TABLET    Take 75 mcg by mouth daily before breakfast. For Hypothyroidism   METOPROLOL SUCCINATE (TOPROL-XL) 100 MG 24 HR TABLET    Take 100 mg by mouth daily. Take with or immediately following a meal.   MIRTAZAPINE (REMERON) 7.5 MG TABLET    Take one tablet by mouth at bedtime for rest/depression   POLYETHYLENE GLYCOL (MIRALAX) PACKET    Take 17 g by mouth daily.   PROTEIN (PROCEL PO)    Take one scoop by mouth twice daily for protein   SIMVASTATIN (ZOCOR) 10 MG TABLET    Take 10 mg by mouth daily. For Hyperlipidemia   UNABLE TO FIND    Med Name: Magic Cup twice daily with med pass @@2  pm and 8 pm  Modified Medications   No medications on file  Discontinued Medications   SPIRONOLACTONE (ALDACTONE) 25 MG TABLET    Take 25 mg by mouth daily.     Physical Exam:  BP 143/63 mmHg  Pulse 63  Temp(Src) 97.3 F (36.3 C) (Oral)  Resp 18  Ht  (1.6 m)  SpO2 98%  Wt Readings from Last 3 Encounters:  12/23/14 129 lb 12.8  oz (58.877 kg)  11/27/14 144 lb 4.8 oz (65.454 kg)  08/14/14 144 lb 3.2 oz (65.409 kg)   General- elderly female in no acute distress Head- atraumatic, normocephalic Eyes- PERRLA, EOMI, no pallor, no icterus, no discharge Neck- no lymphadenopathy, no jvd Mouth- normal mucus membrane, adentulous Cardiovascular- normal s1,s2, no murmurs Respiratory- bilateral clear to auscultation, no wheeze, no rhonchi, no crackles Abdomen- bowel sounds present, soft, non tender Musculoskeletal- able to move right UE. Right AKA. Left sided hemiparesis LUE>LLE. Trace left leg edema.  Neurological- no focal deficit Skin- warm and dry Psychiatry- alert and oriented, normal mood and  affect   Labs reviewed: Basic Metabolic Panel:  Recent Labs  16/10/96 1032 07/16/14 0330 07/17/14 0436 07/21/14 10/18/14 12/02/14  NA 130* 130* 128* 130* 139 142  K 4.4 4.5 4.2 5.0 4.3 3.9  CL 97* 100* 99*  --   --   --   CO2 24 21* 22  --   --   --   GLUCOSE 125* 107* 95  --   --   --   BUN CREATININE 0.78 0.70 0.63 0.7 0.6 0.6  CALCIUM 9.6 9.6 9.2  --   --   --    Liver Function Tests:  Recent Labs  07/10/14 0434 10/18/14 12/02/14  AST 24 10* 10*  ALT ALKPHOS 77 73 87  BILITOT 0.7  --   --   PROT 6.2*  --   --   ALBUMIN 3.1*  --   --    No results for input(s): LIPASE, AMYLASE in the last 8760 hours. No results for input(s): AMMONIA in the last 8760 hours. CBC:  Recent Labs  07/09/14 2130  07/15/14 1032 07/16/14 0330 07/17/14 0436 07/21/14 10/18/14 12/02/14  WBC 11.2*  < > 9.4 11.8* 11.8* 9.9 11.8 10.7  NEUTROABS 8.0*  --   --   --   --   --   --   --   HGB 12.7  < > 13.1 11.8* 11.8* 12.8 11.9* 11.6*  HCT 36.4  < > 37.9 34.7* 33.6* 38 36 36  MCV 88.1  < > 88.8 88.1 88.9  --   --   --   PLT 398  < > 397 367 389 496* 388 348  < > = values in this interval not displayed.  Lab Results  Component Value Date   TSH 1.37 12/02/2014   Lipid Panel     Component Value Date/Time   CHOL 123 12/02/2014   TRIG 77 12/02/2014   HDL 42 12/02/2014   LDLCALC 66 12/02/2014    Assessment/Plan  Constipation Stable, continue miralax daily  Osteoporosis Stable, no falls reported. Continue oscal with vitamin d  PAD S/p right AKA. Continue aspirin. counselled on smoking cessation, has cut down to 1 cigarette qod. Monitor  Copd Breathing stable, no recent exacerbation, continue advair and counselled on smoking cessation   HTN bp reading stable. Continue toprol xl 100 mg daily   Crescentia Boutwell, MD  [facility] [phone] (Monday-Friday 8 am - 5 pm) [phone] (afterhours)        Cbc, cmp, tsh, lipid,  prealbumin

## 2015-05-02 ENCOUNTER — Encounter: Payer: Self-pay | Admitting: Internal Medicine

## 2015-05-02 ENCOUNTER — Non-Acute Institutional Stay (SKILLED_NURSING_FACILITY): Payer: Medicare Other | Admitting: Internal Medicine

## 2015-05-02 DIAGNOSIS — E785 Hyperlipidemia, unspecified: Secondary | ICD-10-CM

## 2015-05-02 DIAGNOSIS — E46 Unspecified protein-calorie malnutrition: Secondary | ICD-10-CM

## 2015-05-02 DIAGNOSIS — E038 Other specified hypothyroidism: Secondary | ICD-10-CM

## 2015-05-02 NOTE — Progress Notes (Signed)
Patient ID: Suzanne Rich, female   DOB: 12-21-1940, 75 y.o.   MRN: [id number]     [facility]  Code Status: DNR  No Known Allergies   Chief Complaint  Patient presents with  . Medical Management of Chronic Issues    Routine Visit    HPI:  75 year old patient is seen for routine visit. She has been at her baseline. appetite is good. Mood is good. Weight has been stable. She has some dry cough but denies any other URI symptom. No behavioral changes reported. No fall. No new skin concern.     Review of Systems:  Constitutional: Negative for fever.  HENT: Negative for headache, congestion. Eyes: Negative for double vision and discharge.  Respiratory: Negative for shortness of breath.   Cardiovascular: Negative for chest pain, palpitations, leg swelling.  Gastrointestinal: Negative for heartburn, nausea, vomiting, abdominal pain Genitourinary: Negative for dysuria Musculoskeletal: Negative for back pain, falls Skin: Negative for itching, rash.  Neurological: Negative for dizziness, tingling Psychiatric/Behavioral: Negative for depression   Past Medical History  Diagnosis Date  . Hypertension   . Stroke (HCC)   . COPD (chronic obstructive pulmonary disease) (HCC)   . Hypothyroidism   . Hyperlipidemia   . Edema   . CHF (congestive heart failure) (HCC)   . Gout   . OP (osteoporosis)     Medications: Patient's Medications  New Prescriptions   No medications on file  Previous Medications   ACETAMINOPHEN (TYLENOL) 650 MG CR TABLET    Take 650 mg by mouth every 8 (eight) hours as needed for pain.   AMBULATORY NON FORMULARY MEDICATION    Medication Name: Med Pass 120 mL four times daily for nutritional support   ASPIRIN 81 MG CHEWABLE TABLET    Chew 81 mg by mouth daily.   CALCIUM-VITAMIN D (OSCAL WITH D) 500-200 MG-UNIT PER TABLET    Take 1 tablet by mouth 3 (three) times daily.   CHOLECALCIFEROL (VITAMIN D3) 1000 UNITS CAPS    Take 2,000 capsules  by mouth daily. Take 1 capsule daily   FLUTICASONE-SALMETEROL (ADVAIR DISKUS) 100-50 MCG/DOSE AEPB    Inhale 1 puff into the lungs 2 (two) times daily.   LEVOTHYROXINE (SYNTHROID, LEVOTHROID) 75 MCG TABLET    Take 75 mcg by mouth daily before breakfast. For Hypothyroidism   METOPROLOL SUCCINATE (TOPROL-XL) 100 MG 24 HR TABLET    Take 100 mg by mouth daily. Take with or immediately following a meal.   MIRTAZAPINE (REMERON) 7.5 MG TABLET    Take one tablet by mouth at bedtime for rest/depression   POLYETHYLENE GLYCOL (MIRALAX) PACKET    Take 17 g by mouth daily.   PROTEIN (PROCEL PO)    Take one scoop by mouth twice daily for protein   SIMVASTATIN (ZOCOR) 10 MG TABLET    Take 10 mg by mouth daily. For Hyperlipidemia   UNABLE TO FIND    Med Name: Magic Cup twice daily with med pass @@2  pm and 8 pm  Modified Medications   No medications on file  Discontinued Medications   ASPIRIN 81 MG TABLET    Take 81 mg by mouth daily.     Physical Exam:  BP 140/70 mmHg  Pulse 60  Temp(Src) 97.4 F (36.3 C) (Oral)  Resp 18  Ht 5\' 3"  (1.6 m)  Wt 145 lb (65.772 kg)  BMI 25.69 kg/m2  SpO2 95%  Wt Readings from Last 3 Encounters:  05/02/15 145 lb (65.772  kg)  12/23/14 129 lb 12.8 oz (58.877 kg)  11/27/14 144 lb 4.8 oz (65.454 kg)   General- elderly female in no acute distress Head- atraumatic, normocephalic Eyes- PERRLA, EOMI, no pallor, no icterus, no discharge Neck- no lymphadenopathy, no jvd Mouth- normal mucus membrane, adentulous Cardiovascular- normal s1,s2, no murmurs Respiratory- bilateral clear to auscultation, no wheeze, no rhonchi, no crackles Abdomen- bowel sounds present, soft, non tender Musculoskeletal- able to move right UE. Right AKA. Left sided hemiparesis LUE>LLE. Trace left leg edema.  Neurological- no focal deficit Skin- warm and dry Psychiatry- alert and oriented, normal mood and affect   Labs reviewed: Basic Metabolic Panel:  Recent Labs  16/10/96 1032  07/16/14 0330 07/17/14 0436 07/21/14 10/18/14 12/02/14  NA 130* 130* 128* 130* 139 142  K 4.4 4.5 4.2 5.0 4.3 3.9  CL 97* 100* 99*  --   --   --   CO2 24 21* 22  --   --   --   GLUCOSE 125* 107* 95  --   --   --   BUN CREATININE 0.78 0.70 0.63 0.7 0.6 0.6  CALCIUM 9.6 9.6 9.2  --   --   --    Liver Function Tests:  Recent Labs  07/10/14 0434 10/18/14 12/02/14  AST 24 10* 10*  ALT ALKPHOS 77 73 87  BILITOT 0.7  --   --   PROT 6.2*  --   --   ALBUMIN 3.1*  --   --    No results for input(s): LIPASE, AMYLASE in the last 8760 hours. No results for input(s): AMMONIA in the last 8760 hours. CBC:  Recent Labs  07/09/14 2130  07/15/14 1032 07/16/14 0330 07/17/14 0436 07/21/14 10/18/14 12/02/14  WBC 11.2*  < > 9.4 11.8* 11.8* 9.9 11.8 10.7  NEUTROABS 8.0*  --   --   --   --   --   --   --   HGB 12.7  < > 13.1 11.8* 11.8* 12.8 11.9* 11.6*  HCT 36.4  < > 37.9 34.7* 33.6* 38 36 36  MCV 88.1  < > 88.8 88.1 88.9  --   --   --   PLT 398  < > 397 367 389 496* 388 348  < > = values in this interval not displayed.  Lab Results  Component Value Date   TSH 1.37 12/02/2014   Lipid Panel     Component Value Date/Time   CHOL 123 12/02/2014   TRIG 77 12/02/2014   HDL 42 12/02/2014   LDLCALC 66 12/02/2014    Assessment/Plan  Protein calorie malnutrition Wt Readings from Last 3 Encounters:  05/02/15 145 lb (65.772 kg)  12/23/14 129 lb 12.8 oz (58.877 kg)  11/27/14 144 lb 4.8 oz (65.454 kg)  stable and has gained weight. Discontinue remeron for now and monitor  Hyperlipidemia Continue simvastatin 10 mg daily and check lipid panel  Hypothyroidism Lab Results  Component Value Date   TSH 1.37 12/02/2014  continue synthroid 75 mcg daily. Check tsh     Kaley Jutras, MD  [facility] [phone] (Monday-Friday 8 am - 5 pm) [phone] (afterhours)        Cbc, cmp, tsh, lipid, prealbumin

## 2015-05-05 LAB — HEPATIC FUNCTION PANEL
ALK PHOS: 76 U/L (ref 25–125)
ALT: 8 U/L (ref 7–35)
AST: 12 U/L — AB (ref 13–35)
Bilirubin, Total: 0.5 mg/dL

## 2015-05-05 LAB — CBC AND DIFFERENTIAL
HCT: 40 % (ref 36–46)
HEMOGLOBIN: 12.8 g/dL (ref 12.0–16.0)
PLATELETS: 319 10*3/uL (ref 150–399)
WBC: 9.5 10*3/mL

## 2015-05-05 LAB — LIPID PANEL
CHOLESTEROL: 136 mg/dL (ref 0–200)
HDL: 55 mg/dL (ref 35–70)
LDL Cholesterol: 70 mg/dL
Triglycerides: 55 mg/dL (ref 40–160)

## 2015-05-05 LAB — BASIC METABOLIC PANEL
BUN: 15 mg/dL (ref 4–21)
CREATININE: 0.6 mg/dL (ref 0.5–1.1)
Glucose: 77 mg/dL
Potassium: 4.3 mmol/L (ref 3.4–5.3)
Sodium: 142 mmol/L (ref 137–147)

## 2015-05-05 LAB — TSH: TSH: 1.56 u[IU]/mL (ref 0.41–5.90)

## 2015-05-21 LAB — CBC AND DIFFERENTIAL
HCT: 39 % (ref 36–46)
Hemoglobin: 13.4 g/dL (ref 12.0–16.0)
Platelets: 276 10*3/uL (ref 150–399)
WBC: 8.7 10*3/mL

## 2015-05-21 LAB — BASIC METABOLIC PANEL
BUN: 14 mg/dL (ref 4–21)
Creatinine: 0.6 mg/dL (ref 0.5–1.1)
Glucose: 133 mg/dL
Potassium: 4.4 mmol/L (ref 3.4–5.3)
SODIUM: 140 mmol/L (ref 137–147)

## 2015-05-21 LAB — HEPATIC FUNCTION PANEL
ALK PHOS: 77 U/L (ref 25–125)
ALT: 12 U/L (ref 7–35)
AST: 22 U/L (ref 13–35)
BILIRUBIN, TOTAL: 0.3 mg/dL

## 2015-06-11 ENCOUNTER — Non-Acute Institutional Stay (SKILLED_NURSING_FACILITY): Payer: Medicare Other | Admitting: Internal Medicine

## 2015-06-11 ENCOUNTER — Encounter: Payer: Self-pay | Admitting: Internal Medicine

## 2015-06-11 DIAGNOSIS — E785 Hyperlipidemia, unspecified: Secondary | ICD-10-CM | POA: Diagnosis not present

## 2015-06-11 DIAGNOSIS — R05 Cough: Secondary | ICD-10-CM | POA: Diagnosis not present

## 2015-06-11 DIAGNOSIS — J439 Emphysema, unspecified: Secondary | ICD-10-CM

## 2015-06-11 DIAGNOSIS — K5900 Constipation, unspecified: Secondary | ICD-10-CM

## 2015-06-11 DIAGNOSIS — E038 Other specified hypothyroidism: Secondary | ICD-10-CM

## 2015-06-11 DIAGNOSIS — K5909 Other constipation: Secondary | ICD-10-CM

## 2015-06-11 DIAGNOSIS — R059 Cough, unspecified: Secondary | ICD-10-CM

## 2015-06-11 NOTE — Progress Notes (Signed)
Patient ID: Suzanne Rich, female   DOB: May 30, 1940, 75 y.o.   MRN: [id number]     [facility]  Code Status: DNR  No Known Allergies   Chief Complaint  Patient presents with  . Medical Management of Chronic Issues    Routine Visit    HPI:  75 year old patient is seen for routine visit. She has been having increased cough and as per staff it is wet cough. She is seen in her room today. No behavioral changes reported. No fall. No new skin concern.     Review of Systems:  Constitutional: Negative for fever.  HENT: Negative for headache Eyes: Negative for double vision and discharge.  Respiratory: Negative for shortness of breath.   Cardiovascular: Negative for chest pain, palpitations, leg swelling.  Gastrointestinal: Negative for heartburn, nausea, vomiting, abdominal pain Genitourinary: Negative for dysuria Musculoskeletal: Negative for back pain, falls Skin: Negative for itching, rash.  Neurological: Negative for dizziness, tingling Psychiatric/Behavioral: Negative for depression   Past Medical History  Diagnosis Date  . Hypertension   . Stroke (HCC)   . COPD (chronic obstructive pulmonary disease) (HCC)   . Hypothyroidism   . Hyperlipidemia   . Edema   . CHF (congestive heart failure) (HCC)   . Gout   . OP (osteoporosis)     Medications: Patient's Medications  New Prescriptions   No medications on file  Previous Medications   ACETAMINOPHEN (TYLENOL) 650 MG CR TABLET    Take 650 mg by mouth every 8 (eight) hours as needed for pain.   AMBULATORY NON FORMULARY MEDICATION    Medication Name: Med Pass 120 mL four times daily for nutritional support   ASPIRIN 81 MG CHEWABLE TABLET    Chew 81 mg by mouth daily.   CALCIUM-VITAMIN D (OSCAL WITH D) 500-200 MG-UNIT PER TABLET    Take 1 tablet by mouth 3 (three) times daily.   CHOLECALCIFEROL (VITAMIN D3) 1000 UNITS CAPS    Take 2,000 capsules by mouth daily. Take 1 capsule daily   FLUTICASONE-SALMETEROL (ADVAIR DISKUS) 100-50 MCG/DOSE AEPB    Inhale 1 puff into the lungs 2 (two) times daily.   LEVOTHYROXINE (SYNTHROID, LEVOTHROID) 75 MCG TABLET    Take 75 mcg by mouth daily before breakfast. For Hypothyroidism   METOPROLOL SUCCINATE (TOPROL-XL) 100 MG 24 HR TABLET    Take 100 mg by mouth daily. Take with or immediately following a meal.   POLYETHYLENE GLYCOL (MIRALAX) PACKET    Take 17 g by mouth daily.   PROTEIN (PROCEL PO)    Take one scoop by mouth twice daily for protein   SIMVASTATIN (ZOCOR) 10 MG TABLET    Take 10 mg by mouth daily. For Hyperlipidemia   UNABLE TO FIND    Med Name: Magic Cup twice daily with med pass @@2  pm and 8 pm  Modified Medications   No medications on file  Discontinued Medications   MIRTAZAPINE (REMERON) 7.5 MG TABLET    Take one tablet by mouth at bedtime for rest/depression     Physical Exam:  BP 118/61 mmHg  Pulse 66  Temp(Src) 98.2 F (36.8 C) (Oral)  Resp 16  Ht  (1.676 m)  Wt 143 lb 12.8 oz (65.227 kg)  BMI 23.22 kg/m2  SpO2 93%  Wt Readings from Last 3 Encounters:  06/11/15 143 lb 12.8 oz (65.227 kg)  05/02/15 145 lb (65.772 kg)  12/23/14 129 lb 12.8 oz (58.877 kg)   General- elderly  female in no acute distress Head- atraumatic, normocephalic Nose- no maxillary sinus tenderness Eyes- PERRLA, EOMI, no pallor, no icterus, no discharge Neck- no lymphadenopathy, no jvd Mouth- normal mucus membrane, adentulous Cardiovascular- normal s1,s2, no murmurs Respiratory- bilateral clear to auscultation, no wheeze, no rhonchi, no crackles Abdomen- bowel sounds present, soft, non tender Musculoskeletal- able to move right UE. Right AKA. Left sided hemiparesis LUE>LLE. Trace left leg edema.  Neurological- no focal deficit Skin- warm and dry Psychiatry- alert and oriented, normal mood and affect   Labs reviewed: Basic Metabolic Panel:  Recent Labs  45/40/9805/16/16 1032 07/16/14 0330 07/17/14 0436  12/02/14 05/05/15 05/21/15   NA 130* 130* 128*  < > 142 142 140  K 4.4 4.5 4.2  < > 3.9 4.3 4.4  CL 97* 100* 99*  --   --   --   --   CO2 24 21* 22  --   --   --   --   GLUCOSE 125* 107* 95  --   --   --   --   BUN 6 8 6   < > 11 15 14   CREATININE 0.78 0.70 0.63  < > 0.6 0.6 0.6  CALCIUM 9.6 9.6 9.2  --   --   --   --   < > = values in this interval not displayed. Liver Function Tests:  Recent Labs  07/10/14 0434  12/02/14 05/05/15 05/21/15  AST 24  < > 10* 12* 22  ALT 18  < > 7 8 12   ALKPHOS 77  < > 87 76 77  BILITOT 0.7  --   --   --   --   PROT 6.2*  --   --   --   --   ALBUMIN 3.1*  --   --   --   --   < > = values in this interval not displayed. No results for input(s): LIPASE, AMYLASE in the last 8760 hours. No results for input(s): AMMONIA in the last 8760 hours. CBC:  Recent Labs  07/09/14 2130  07/15/14 1032 07/16/14 0330 07/17/14 0436  12/02/14 05/05/15 05/21/15  WBC 11.2*  < > 9.4 11.8* 11.8*  < > 10.7 9.5 8.7  NEUTROABS 8.0*  --   --   --   --   --   --   --   --   HGB 12.7  < > 13.1 11.8* 11.8*  < > 11.6* 12.8 13.4  HCT 36.4  < > 37.9 34.7* 33.6*  < > 36 40 39  MCV 88.1  < > 88.8 88.1 88.9  --   --   --   --   PLT 398  < > 397 367 389  < > 348 319 276  < > = values in this interval not displayed.  Lab Results  Component Value Date   TSH 1.56 05/05/2015   Lipid Panel     Component Value Date/Time   CHOL 136 05/05/2015   TRIG 55 05/05/2015   HDL 55 05/05/2015   LDLCALC 70 05/05/2015    Assessment/Plan  Cough With upper airway congestion. Add mucinex dm 1200 mg bid. Get chest xray to rule out infiltrates.   Constipation Stable, continue miralax  COPD Monitor for signs of exacerbation with her cough. Continue advair.   Hyperlipidemia Continue simvastatin 10 mg daily. LDL at goal  Hypothyroidism Lab Results  Component Value Date   TSH 1.56 05/05/2015  continue synthroid 75 mcg daily.  Oneal Grout, MD  [facility] [phone] (Monday-Friday 8  am - 5 pm) [phone] (afterhours)

## 2015-06-19 LAB — HEPATIC FUNCTION PANEL
ALT: 13 U/L (ref 7–35)
AST: 14 U/L (ref 13–35)
Alkaline Phosphatase: 73 U/L (ref 25–125)
BILIRUBIN, TOTAL: 0.3 mg/dL

## 2015-06-19 LAB — CBC AND DIFFERENTIAL
HEMATOCRIT: 38 % (ref 36–46)
Hemoglobin: 12.9 g/dL (ref 12.0–16.0)
Platelets: 294 10*3/uL (ref 150–399)
WBC: 11 10^3/mL

## 2015-06-19 LAB — BASIC METABOLIC PANEL
BUN: 13 mg/dL (ref 4–21)
Creatinine: 0.7 mg/dL (ref 0.5–1.1)
GLUCOSE: 86 mg/dL
Potassium: 4.3 mmol/L (ref 3.4–5.3)
SODIUM: 138 mmol/L (ref 137–147)

## 2015-07-08 LAB — HEPATIC FUNCTION PANEL
ALT: 21 U/L (ref 7–35)
AST: 24 U/L (ref 13–35)
Alkaline Phosphatase: 79 U/L (ref 25–125)
BILIRUBIN, TOTAL: 0.3 mg/dL

## 2015-07-08 LAB — CBC AND DIFFERENTIAL
HCT: 40 % (ref 36–46)
HEMOGLOBIN: 13.5 g/dL (ref 12.0–16.0)
PLATELETS: 409 10*3/uL — AB (ref 150–399)
WBC: 8.5 10*3/mL

## 2015-07-08 LAB — BASIC METABOLIC PANEL
BUN: 12 mg/dL (ref 4–21)
CREATININE: 0.7 mg/dL (ref 0.5–1.1)
Glucose: 91 mg/dL
Potassium: 4.4 mmol/L (ref 3.4–5.3)
Sodium: 135 mmol/L — AB (ref 137–147)

## 2015-07-09 ENCOUNTER — Encounter: Payer: Self-pay | Admitting: Vascular Surgery

## 2015-07-09 ENCOUNTER — Ambulatory Visit (INDEPENDENT_AMBULATORY_CARE_PROVIDER_SITE_OTHER): Payer: Medicare Other | Admitting: Vascular Surgery

## 2015-07-09 VITALS — BP 152/86 | HR 70 | Temp 97.8°F | Ht 66.0 in | Wt 140.0 lb

## 2015-07-09 DIAGNOSIS — I739 Peripheral vascular disease, unspecified: Secondary | ICD-10-CM | POA: Diagnosis not present

## 2015-07-09 NOTE — Progress Notes (Signed)
Referring Physician: Oneal Grout, MD  Patient name: Suzanne Rich MRN: [id number] DOB: 04-12-1940 Sex: female  REASON FOR CONSULT: left toe ulcer  HPI: Suzanne Rich is a 75 y.o. female,  who previously underwent right above-knee amputation for nonhealing wound of the right leg. This was approximately one year ago. She was referred here today for a dark spot on her left first toe. She is paraplegic on the left side from a previous stroke. She also has dementia. Her daughter was present for the office visit today. She does not have any fever or chills. There is no open skin break. She does not use the left leg for transfer for she is essentially total assist. Other medical problems include hypertension, COPD, hyperlipidemia, congestive failure all of which are currently stable.  Past Medical History  Diagnosis Date  . Hypertension   . Stroke (HCC)   . COPD (chronic obstructive pulmonary disease) (HCC)   . Hypothyroidism   . Hyperlipidemia   . Edema   . CHF (congestive heart failure) (HCC)   . Gout   . OP (osteoporosis)    Past Surgical History  Procedure Laterality Date  . Abdominal hysterectomy    . Amputation Right 07/15/2014    Procedure: AMPUTATION ABOVE KNEE-RIGHT;  Surgeon: Sherren Kerns, MD;  Location: [facility] OR;  Service: Vascular;  Laterality: Right;    Family History  Problem Relation Age of Onset  . Colon cancer Mother   . Ovarian cancer Mother   . Emphysema Sister   . Heart failure Daughter     SOCIAL HISTORY: Social History   Social History  . Marital Status: Single    Spouse Name: N/A  . Number of Children: N/A  . Years of Education: N/A   Occupational History  . Not on file.   Social History Main Topics  . Smoking status: Current Every Day Smoker -- 0.10 packs/day    Types: Cigarettes  . Smokeless tobacco: Never Used  . Alcohol Use: No  . Drug Use: Not on file  . Sexual Activity: Not on file   Other Topics Concern  . Not on file   Social  History Narrative    No Known Allergies  Current Outpatient Prescriptions  Medication Sig Dispense Refill  . acetaminophen (TYLENOL) 650 MG CR tablet Take 650 mg by mouth every 8 (eight) hours as needed for pain.    Marland Kitchen AMBULATORY NON FORMULARY MEDICATION Medication Name: Med Pass 120 mL four times daily for nutritional support    . aspirin 81 MG chewable tablet Chew 81 mg by mouth daily.    . calcium-vitamin D (OSCAL WITH D) 500-200 MG-UNIT per tablet Take 1 tablet by mouth 3 (three) times daily.    . Cholecalciferol (VITAMIN D3) 1000 UNITS CAPS Take 2,000 capsules by mouth daily. Take 1 capsule daily    . Fluticasone-Salmeterol (ADVAIR DISKUS) 100-50 MCG/DOSE AEPB Inhale 1 puff into the lungs 2 (two) times daily.    Marland Kitchen levothyroxine (SYNTHROID, LEVOTHROID) 75 MCG tablet Take 75 mcg by mouth daily before breakfast. For Hypothyroidism    . metoprolol succinate (TOPROL-XL) 100 MG 24 hr tablet Take 100 mg by mouth daily. Take with or immediately following a meal.    . polyethylene glycol (MIRALAX) packet Take 17 g by mouth daily.    . Protein (PROCEL PO) Take one scoop by mouth twice daily for protein    . simvastatin (ZOCOR) 10 MG tablet Take 10 mg by mouth daily. For Hyperlipidemia    .  UNABLE TO FIND Med Name: Magic Cup twice daily with med pass @@2  pm and 8 pm     No current facility-administered medications for this visit.    ROS:   General:  No weight loss, Fever, chills   Psychological: No history of anxiety,  No history of depression, has history of dementia   Physical Examination  Filed Vitals:   07/09/15 0830  BP: 152/86  Pulse: 70  Temp: 97.8 F (36.6 C)  TempSrc: Oral  Height: 5\' 6"  (1.676 m)  Weight: 140 lb (63.504 kg)  SpO2: 97%    Body mass index is 22.61 kg/(m^2).  General:  Alert and oriented, no acute distress HEENT: Normal Cardiac: Regular Rate and Rhythm without murmur Skin: No rash, 3 mm dark spot tip left first toe no open ulceration no skin  break Extremity Pulses:  2+ femoral, absent popliteal dorsalis pedis, posterior tibial pulses left leg Musculoskeletal: No deformity or edema  Neurologic: Upper and lower extremity motor 5/5 and symmetric   ASSESSMENT:  Dark spot left first toe most likely either pressure related or some trauma in patient with known left leg peripheral arterial disease who is a non-revascularization candidate.   PLAN:  Protect left foot dry dressing to pad left foot and prevent further injury. If patient develops an open wound she can follow up in the wound center. If she has progression of this wound that is debilitating to 4 or increasing pain or infection only consideration would be for an amputation and I think we are currently a long ways from this. The patient will follow-up on as-needed basis.   Fabienne Brunsharles Kellene Mccleary, MD [facility] Office: [phone] Pager: [phone]

## 2015-07-10 ENCOUNTER — Encounter: Payer: Self-pay | Admitting: Internal Medicine

## 2015-07-11 ENCOUNTER — Encounter: Payer: Self-pay | Admitting: Internal Medicine

## 2015-07-11 ENCOUNTER — Non-Acute Institutional Stay (SKILLED_NURSING_FACILITY): Payer: Medicare Other | Admitting: Internal Medicine

## 2015-07-11 DIAGNOSIS — I739 Peripheral vascular disease, unspecified: Secondary | ICD-10-CM

## 2015-07-11 DIAGNOSIS — R938 Abnormal findings on diagnostic imaging of other specified body structures: Secondary | ICD-10-CM | POA: Diagnosis not present

## 2015-07-11 DIAGNOSIS — R9389 Abnormal findings on diagnostic imaging of other specified body structures: Secondary | ICD-10-CM

## 2015-07-11 NOTE — Progress Notes (Signed)
Patient ID: Suzanne Rich, female   DOB: 05-Apr-1940, 75 y.o.   MRN: [id number]   Progress Note    Location:  [facility] Room Number: 505 Place of Service:  SNF (31) Provider:  Oneal Grout, MD  Patient Care Team: Oneal Grout, MD as PCP - General (Internal Medicine) Oneal Grout, MD as Consulting Physician (Internal Medicine)  Extended Emergency Contact Information Primary Emergency Contact: Milton,Sharon Address: [address]          [place] [address] [place] Home Phone: [phone] Mobile Phone: [phone] Relation: Daughter Secondary Emergency Contact: Milton,Terrell Address: [address]          [place], [place] [address] [place] Home Phone: [phone] Relation: Other  Goals of care: Advanced Directive information Advanced Directives 07/11/2015  Does patient have an advance directive? Yes  Type of Estate agent of [place];Out of facility DNR (pink MOST or yellow form)  Copy of advanced directive(s) in chart? Yes  Would patient like information on creating an advanced directive? -  Pre-existing out of facility DNR order (yellow form or pink MOST form) Yellow form placed in chart (order not valid for inpatient use)     Chief Complaint  Patient presents with  . Acute Visit    abnormal chest x-ray, deterioration peripheral vascular disease.    HPI:  Pt is a 75 y.o. female seen today for an acute visit for 2 problems.  She has a rattling cough. CXR on 07/10/15 showed a 2.7 cm opacity in the lower right hilar area.  She has a scab at the tip of the leftt great toe that worries the nurses. She has known peripheral vascular disease. She denies pain. She saw Dr. Darrick Penna, Vascular surgeon on 07/09/15. He did not think she is a revascularization candidate.   Past Medical History  Diagnosis Date  . Hypertension   . Stroke (HCC)   . COPD (chronic obstructive pulmonary  disease) (HCC)   . Hypothyroidism   . Hyperlipidemia   . Edema   . CHF (congestive heart failure) (HCC)   . Gout   . OP (osteoporosis)    Past Surgical History  Procedure Laterality Date  . Abdominal hysterectomy    . Amputation Right 07/15/2014    Procedure: AMPUTATION ABOVE KNEE-RIGHT;  Surgeon: Sherren Kerns, MD;  Location: [facility] OR;  Service: Vascular;  Laterality: Right;    No Known Allergies    Medication List       This list is accurate as of: 07/11/15  2:10 PM.  Always use your most recent med list.               acetaminophen 650 MG CR tablet  Commonly known as:  TYLENOL  Take 650 mg by mouth every 8 (eight) hours as needed for pain.     ADVAIR DISKUS 100-50 MCG/DOSE Aepb  Generic drug:  Fluticasone-Salmeterol  Inhale 1 puff into the lungs 2 (two) times daily.     AMBULATORY NON FORMULARY MEDICATION  Medication Name: Med Pass 120 mL four times daily for nutritional support     aspirin 81 MG chewable tablet  Chew 81 mg by mouth daily.     calcium-vitamin D 500-200 MG-UNIT tablet  Commonly known as:  OSCAL WITH D  Take 1 tablet by mouth 3 (three) times daily.     ipratropium-albuterol 0.5-2.5 (3) MG/3ML Soln  Commonly known as:  DUONEB  Take 3 mLs by nebulization. Every  6 hours for 3 days. Started 07/10/15     levothyroxine 75 MCG tablet  Commonly known as:  SYNTHROID, LEVOTHROID  Take 75 mcg by mouth daily before breakfast. For Hypothyroidism     metoprolol succinate 100 MG 24 hr tablet  Commonly known as:  TOPROL-XL  Take 100 mg by mouth daily. Take with or immediately following a meal.     ondansetron 4 MG tablet  Commonly known as:  ZOFRAN  Take 4 mg by mouth. Take one tablet every 6 hours as needed for nausea.     polyethylene glycol packet  Commonly known as:  MIRALAX  Take 17 g by mouth daily.     PROCEL PO  Take one scoop by mouth twice daily for protein     simvastatin 10 MG tablet  Commonly known as:  ZOCOR  Take 10 mg by mouth  daily. For Hyperlipidemia     UNABLE TO FIND  Med Name: Magic Cup twice daily with med pass @@2  pm and 8 pm     Vitamin D3 1000 units Caps  Take 2,000 capsules by mouth daily. Take 1 capsule daily        Review of Systems  Constitutional: Negative for activity change, appetite change, chills, diaphoresis, fatigue, fever and unexpected weight change.  HENT: Negative for congestion, ear discharge, ear pain, hearing loss, postnasal drip, rhinorrhea, sore throat, tinnitus, trouble swallowing and voice change.   Eyes: Negative for pain, redness, itching and visual disturbance.  Respiratory: Positive for shortness of breath. Negative for cough, choking and wheezing.        Emphysema. Smoker.  Cardiovascular: Negative for chest pain, palpitations and leg swelling.       PAD. Hx CHF.  Gastrointestinal: Positive for constipation. Negative for abdominal distention, abdominal pain, diarrhea and nausea.  Endocrine: Negative for cold intolerance, heat intolerance, polydipsia, polyphagia and polyuria.  Genitourinary: Negative for difficulty urinating, dysuria, flank pain, frequency, hematuria, pelvic pain, urgency and vaginal discharge.       CKD  Musculoskeletal: Positive for arthralgias and myalgias. Negative for back pain, gait problem, neck pain and neck stiffness.       Hx right AKA  Skin: Negative for color change, pallor and rash.       Ischemic lesion on the left great toe.  Allergic/Immunologic: Negative.   Neurological: Negative for dizziness, tremors, seizures, syncope, weakness, numbness and headaches.       Hx CVA with left hemiparesis  Hematological: Negative for adenopathy. Does not bruise/bleed easily.  Psychiatric/Behavioral: Negative for agitation, behavioral problems, confusion, dysphoric mood, hallucinations, sleep disturbance and suicidal ideas. The patient is not nervous/anxious and is not hyperactive.     Immunization History  Administered Date(s) Administered  .  Influenza-Unspecified 11/26/2012, 11/26/2013, 12/12/2014  . PPD Test 11/29/2012, 07/17/2014  . Pneumococcal-Unspecified 11/16/2013   Pertinent  Health Maintenance Due  Topic Date Due  . PNA vac Low Risk Adult (2 of 2 - PCV13) 03/01/2016 (Originally 11/17/2014)  . INFLUENZA VACCINE  09/30/2015  . DEXA SCAN  Completed   Fall Risk  07/09/2015  Falls in the past year? No    Filed Vitals:   07/11/15 1400  Height:  (1.702 m)  Weight: 145 lb (65.772 kg)   Body mass index is 22.71 kg/(m^2). Physical Exam  Constitutional: She is oriented to person, place, and time. She appears well-developed and well-nourished. No distress.  HENT:  Right Ear: External ear normal.  Left Ear: External ear normal.  Nose: Nose normal.  Mouth/Throat: Oropharynx is clear and moist. No oropharyngeal exudate.  Eyes: Conjunctivae and EOM are normal. Pupils are equal, round, and reactive to light. No scleral icterus.  Neck: No JVD present. No tracheal deviation present. No thyromegaly present.  Cardiovascular: Normal rate, regular rhythm, normal heart sounds and intact distal pulses.  Exam reveals no gallop and no friction rub.   No murmur heard. Pulmonary/Chest: Effort normal. No respiratory distress. She has no wheezes. She has no rales. She exhibits no tenderness.  Abdominal: She exhibits no distension and no mass. There is no tenderness.  Musculoskeletal: Normal range of motion. She exhibits no edema or tenderness.  Right AKA  Lymphadenopathy:    She has no cervical adenopathy.  Neurological: She is alert and oriented to person, place, and time. No cranial nerve deficit. Coordination normal.  Left hemiparesis  Skin: No rash noted. She is not diaphoretic. No erythema. No pallor.  Dark ischemic area at the tip of the left great toe with a scab.  Psychiatric: She has a normal mood and affect. Her behavior is normal. Judgment and thought content normal.    Labs reviewed:  Recent Labs  07/15/14 1032  07/16/14 0330 07/17/14 0436  12/02/14 05/05/15 05/21/15  NA 130* 130* 128*  < > 142 142 140  K 4.4 4.5 4.2  < > 3.9 4.3 4.4  CL 97* 100* 99*  --   --   --   --   CO2 24 21* 22  --   --   --   --   GLUCOSE 125* 107* 95  --   --   --   --   BUN 6 8 6   < > 11 15 14   CREATININE 0.78 0.70 0.63  < > 0.6 0.6 0.6  CALCIUM 9.6 9.6 9.2  --   --   --   --   < > = values in this interval not displayed.  Recent Labs  12/02/14 05/05/15 05/21/15  AST 10* 12* 22  ALT 7 8 12   ALKPHOS 87 76 77    Recent Labs  07/15/14 1032 07/16/14 0330 07/17/14 0436  12/02/14 05/05/15 05/21/15  WBC 9.4 11.8* 11.8*  < > 10.7 9.5 8.7  HGB 13.1 11.8* 11.8*  < > 11.6* 12.8 13.4  HCT 37.9 34.7* 33.6*  < > 36 40 39  MCV 88.8 88.1 88.9  --   --   --   --   PLT 397 367 389  < > 348 319 276  < > = values in this interval not displayed. Lab Results  Component Value Date   TSH 1.56 05/05/2015   No results found for: HGBA1C Lab Results  Component Value Date   CHOL 136 05/05/2015   HDL 55 05/05/2015   LDLCALC 70 05/05/2015   TRIG 55 05/05/2015    Assessment/Plan 1. PAD (peripheral artery disease) (HCC) Continue to watch the area of the tip of the left great toe.   2. Abnormal CXR *follow up xray

## 2015-08-01 ENCOUNTER — Non-Acute Institutional Stay (SKILLED_NURSING_FACILITY): Payer: Medicare Other | Admitting: Internal Medicine

## 2015-08-01 ENCOUNTER — Encounter: Payer: Self-pay | Admitting: Internal Medicine

## 2015-08-01 DIAGNOSIS — E038 Other specified hypothyroidism: Secondary | ICD-10-CM | POA: Diagnosis not present

## 2015-08-01 DIAGNOSIS — N390 Urinary tract infection, site not specified: Secondary | ICD-10-CM

## 2015-08-01 DIAGNOSIS — I96 Gangrene, not elsewhere classified: Secondary | ICD-10-CM | POA: Diagnosis not present

## 2015-08-01 NOTE — Progress Notes (Signed)
Patient ID: Suzanne Rich, female   DOB: 08-16-1940, 75 y.o.   MRN: [id number]     [facility]  Code Status: DNR  No Known Allergies   Chief Complaint  Patient presents with  . Medical Management of Chronic Issues    Routine Visit    HPI:  75 year old patient is seen for routine visit. She has been seen by vascular team for darkened area to her left great toe. Plan is to clinically monitor for now. She continues to smoke. She recently completed antibiotic course for UTI. Denies any concern this visit.       Review of Systems:  Constitutional: Negative for fever.  HENT: Negative for headache Eyes: Negative for double vision and discharge.  Respiratory: Negative for shortness of breath.   Cardiovascular: Negative for chest pain, palpitations, leg swelling.  Gastrointestinal: Negative for heartburn, nausea, vomiting, abdominal pain Genitourinary: Negative for dysuria Musculoskeletal: Negative for back pain, falls Skin: Negative for itching, rash.  Neurological: Negative for dizziness, tingling Psychiatric/Behavioral: Negative for depression   Past Medical History  Diagnosis Date  . Hypertension   . Stroke (HCC)   . COPD (chronic obstructive pulmonary disease) (HCC)   . Hypothyroidism   . Hyperlipidemia   . Edema   . CHF (congestive heart failure) (HCC)   . Gout   . OP (osteoporosis)     Medications: Patient's Medications  New Prescriptions   No medications on file  Previous Medications   ACETAMINOPHEN (TYLENOL) 650 MG CR TABLET    Take 650 mg by mouth every 8 (eight) hours as needed for pain.   AMBULATORY NON FORMULARY MEDICATION    Medication Name: Med Pass 120 mL four times daily for nutritional support   ASPIRIN 81 MG CHEWABLE TABLET    Chew 81 mg by mouth daily.   CALCIUM-VITAMIN D (OSCAL WITH D) 500-200 MG-UNIT PER TABLET    Take 1 tablet by mouth 3 (three) times daily.   CHOLECALCIFEROL (VITAMIN D3) 1000 UNITS CAPS    Take 2,000  capsules by mouth daily. Take 1 capsule daily   FLUTICASONE-SALMETEROL (ADVAIR DISKUS) 100-50 MCG/DOSE AEPB    Inhale 1 puff into the lungs 2 (two) times daily.   LEVOTHYROXINE (SYNTHROID, LEVOTHROID) 75 MCG TABLET    Take 75 mcg by mouth daily before breakfast. For Hypothyroidism   METOPROLOL SUCCINATE (TOPROL-XL) 100 MG 24 HR TABLET    Take 100 mg by mouth daily. Take with or immediately following a meal.   ONDANSETRON (ZOFRAN) 4 MG TABLET    Take 4 mg by mouth. Take one tablet every 6 hours as needed for nausea.   POLYETHYLENE GLYCOL (MIRALAX) PACKET    Take 17 g by mouth daily.   PROTEIN (PROCEL PO)    Take one scoop by mouth twice daily for protein   SIMVASTATIN (ZOCOR) 10 MG TABLET    Take 10 mg by mouth daily. For Hyperlipidemia   UNABLE TO FIND    Med Name: Magic Cup twice daily with med pass @@2  pm and 8 pm  Modified Medications   No medications on file  Discontinued Medications   IPRATROPIUM-ALBUTEROL (DUONEB) 0.5-2.5 (3) MG/3ML SOLN    Take 3 mLs by nebulization. Every 6 hours for 3 days. Started 07/10/15     Physical Exam:  BP 138/78 mmHg  Pulse 68  Temp(Src) 97.6 F (36.4 C) (Oral)  Resp 20  Ht 5\' 6"  (1.676 m)  Wt 149 lb 12.8 oz (67.949 kg)  BMI 24.19 kg/m2  SpO2 96%  Wt Readings from Last 3 Encounters:  08/01/15 149 lb 12.8 oz (67.949 kg)  07/11/15 145 lb (65.772 kg)  07/09/15 140 lb (63.504 kg)   General- elderly female in no acute distress Head- atraumatic, normocephalic Nose- no maxillary sinus tenderness Eyes- PERRLA, EOMI, no pallor, no icterus, no discharge Neck- no lymphadenopathy, no jvd Mouth- normal mucus membrane, adentulous Cardiovascular- normal s1,s2, no murmurs Respiratory- bilateral clear to auscultation, no wheeze, no rhonchi, no crackles Abdomen- bowel sounds present, soft, non tender Musculoskeletal- able to move right UE. Right AKA. Left sided hemiparesis LUE>LLE. Trace left leg edema.  Neurological- no focal deficit Skin- warm and dry,  left great toe black area without skin breakdown Psychiatry- alert and oriented, normal mood and affect   Labs reviewed: Basic Metabolic Panel:  Recent Labs  33/29/51 06/19/15 07/08/15  NA 140 138 135*  K 4.4 4.3 4.4  BUN CREATININE 0.6 0.7 0.7   Liver Function Tests:  Recent Labs  05/21/15 06/19/15 07/08/15  AST ALT ALKPHOS 77 73 79   No results for input(s): LIPASE, AMYLASE in the last 8760 hours. No results for input(s): AMMONIA in the last 8760 hours. CBC:  Recent Labs  05/21/15 06/19/15 07/08/15  WBC 8.7 11.0 8.5  HGB 13.4 12.9 13.5  HCT 39 38 40  PLT 276 294 409*    Lab Results  Component Value Date   TSH 1.56 05/05/2015   Lipid Panel     Component Value Date/Time   CHOL 136 05/05/2015   TRIG 55 05/05/2015   HDL 55 05/05/2015   LDLCALC 70 05/05/2015    Assessment/Plan  Left great toe discoloration Seen by vascular team. Monitor clinically for now. Monitor for signs of infection, to be followed by treatment nurse  UTI completed 3 days course of ciprofloxacin, monitor clinically  Hypothyroidism Lab Results  Component Value Date   TSH 1.56 05/05/2015  continue synthroid 75 mcg daily.     Oneal Grout, MD  [facility] [phone] (Monday-Friday 8 am - 5 pm) [phone] (afterhours)

## 2015-09-16 ENCOUNTER — Encounter: Payer: Self-pay | Admitting: Internal Medicine

## 2015-09-16 ENCOUNTER — Non-Acute Institutional Stay (SKILLED_NURSING_FACILITY): Payer: Medicare Other | Admitting: Internal Medicine

## 2015-09-16 DIAGNOSIS — E559 Vitamin D deficiency, unspecified: Secondary | ICD-10-CM

## 2015-09-16 DIAGNOSIS — E46 Unspecified protein-calorie malnutrition: Secondary | ICD-10-CM | POA: Diagnosis not present

## 2015-09-16 DIAGNOSIS — J439 Emphysema, unspecified: Secondary | ICD-10-CM | POA: Diagnosis not present

## 2015-09-16 NOTE — Progress Notes (Signed)
Patient ID: Suzanne Rich, female   DOB: 04/27/1940, 75 y.o.   MRN: [id number]     [facility]  Code Status: DNR  No Known Allergies   Chief Complaint  Patient presents with  . Medical Management of Chronic Issues    Routine Visit    HPI:  75 year old patient is seen for routine visit. She denies any concern this visit.    Review of Systems:  Constitutional: Negative for fever.  HENT: Negative for headache Eyes: Negative for discharge.  Respiratory: Negative for shortness of breath.   Cardiovascular: Negative for chest pain, palpitations, leg swelling.  Gastrointestinal: Negative for heartburn, nausea, vomiting, abdominal pain Genitourinary: Negative for dysuria Musculoskeletal: Negative for falls Skin: Negative for itching, rash.  Neurological: Negative for dizziness Psychiatric/Behavioral: Negative for depression   Past Medical History  Diagnosis Date  . Hypertension   . Stroke (HCC)   . COPD (chronic obstructive pulmonary disease) (HCC)   . Hypothyroidism   . Hyperlipidemia   . Edema   . CHF (congestive heart failure) (HCC)   . Gout   . OP (osteoporosis)     Medications: Patient's Medications  New Prescriptions   No medications on file  Previous Medications   ACETAMINOPHEN (TYLENOL) 650 MG CR TABLET    Take 650 mg by mouth every 8 (eight) hours as needed for pain.   AMBULATORY NON FORMULARY MEDICATION    Medication Name: Med Pass 120 mL four times daily for nutritional support   ASPIRIN 81 MG CHEWABLE TABLET    Chew 81 mg by mouth daily.   CALCIUM-VITAMIN D (OSCAL WITH D) 500-200 MG-UNIT PER TABLET    Take 1 tablet by mouth 3 (three) times daily.   CHOLECALCIFEROL (VITAMIN D3) 1000 UNITS CAPS    Take 2,000 capsules by mouth daily. Take 1 capsule daily   FLUTICASONE-SALMETEROL (ADVAIR DISKUS) 100-50 MCG/DOSE AEPB    Inhale 1 puff into the lungs 2 (two) times daily.   LEVOTHYROXINE (SYNTHROID, LEVOTHROID) 75 MCG TABLET    Take 75 mcg  by mouth daily before breakfast. For Hypothyroidism   METOPROLOL SUCCINATE (TOPROL-XL) 100 MG 24 HR TABLET    Take 100 mg by mouth daily. Take with or immediately following a meal.   ONDANSETRON (ZOFRAN) 4 MG TABLET    Take 4 mg by mouth. Take one tablet every 6 hours as needed for nausea.   POLYETHYLENE GLYCOL (MIRALAX) PACKET    Take 17 g by mouth daily.   PROTEIN (PROCEL) POWD    Take 2 scoop by mouth 2 (two) times daily.   SIMVASTATIN (ZOCOR) 10 MG TABLET    Take 10 mg by mouth daily. For Hyperlipidemia   UNABLE TO FIND    Med Name: Magic Cup twice daily with med pass @@2  pm and 8 pm  Modified Medications   No medications on file  Discontinued Medications   PROTEIN (PROCEL PO)    Take one scoop by mouth twice daily for protein     Physical Exam:  BP 123/61 mmHg  Pulse 65  Temp(Src) 97.8 F (36.6 C) (Oral)  Resp 18  Ht  (1.676 m)  Wt 150 lb (68.04 kg)  BMI 24.22 kg/m2  SpO2 92%  Wt Readings from Last 3 Encounters:  09/16/15 150 lb (68.04 kg)  08/01/15 149 lb 12.8 oz (67.949 kg)  07/11/15 145 lb (65.772 kg)   General- 75 elderly female in no acute distress Head- atraumatic, normocephalic Nose- no maxillary sinus  tenderness Eyes- PERRLA, EOMI, no pallor, no icterus, no discharge Neck- no lymphadenopathy Mouth- normal mucus membrane, adentulous Cardiovascular- normal s1,s2, no murmur Respiratory- bilateral clear to auscultation Abdomen- bowel sounds present, soft, non tender Musculoskeletal- able to move right UE. Right AKA. Left sided hemiparesis LUE>LLE. Trace left leg edema.  Neurological- no focal deficit Skin- warm and dry Psychiatry- alert and oriented, normal mood and affect   Labs reviewed: Basic Metabolic Panel:  Recent Labs  16/11/9601/22/17 06/19/15 07/08/15  NA 140 138 135*  K 4.4 4.3 4.4  BUN 14 13 12   CREATININE 0.6 0.7 0.7   Liver Function Tests:  Recent Labs  05/21/15 06/19/15 07/08/15  AST 22 14 24   ALT 12 13 21   ALKPHOS 77 73 79   No results  for input(s): LIPASE, AMYLASE in the last 8760 hours. No results for input(s): AMMONIA in the last 8760 hours. CBC:  Recent Labs  05/21/15 06/19/15 07/08/15  WBC 8.7 11.0 8.5  HGB 13.4 12.9 13.5  HCT 39 38 40  PLT 276 294 409*    Lab Results  Component Value Date   TSH 1.56 05/05/2015   Lipid Panel     Component Value Date/Time   CHOL 136 05/05/2015   TRIG 55 05/05/2015   HDL 55 05/05/2015   LDLCALC 70 05/05/2015    Assessment/Plan  Copd Stable breathing, no recent exacerbation. Continue advair  Vitamin d def Continue vitamin d 3 2000 u daily and monitor  Protein calorie malnutrition Stable weight, continue procel with medpass and magic cup, RD following   Oneal GroutMAHIMA Antoino Westhoff, MD  [facility] [phone] (Monday-Friday 8 am - 5 pm) [phone] (afterhours)

## 2015-10-21 ENCOUNTER — Encounter: Payer: Self-pay | Admitting: Internal Medicine

## 2015-10-21 ENCOUNTER — Non-Acute Institutional Stay (SKILLED_NURSING_FACILITY): Payer: Medicare Other | Admitting: Internal Medicine

## 2015-10-21 DIAGNOSIS — Z8673 Personal history of transient ischemic attack (TIA), and cerebral infarction without residual deficits: Secondary | ICD-10-CM

## 2015-10-21 DIAGNOSIS — G8194 Hemiplegia, unspecified affecting left nondominant side: Secondary | ICD-10-CM | POA: Diagnosis not present

## 2015-10-21 DIAGNOSIS — I739 Peripheral vascular disease, unspecified: Secondary | ICD-10-CM | POA: Diagnosis not present

## 2015-10-21 DIAGNOSIS — E782 Mixed hyperlipidemia: Secondary | ICD-10-CM | POA: Diagnosis not present

## 2015-10-21 NOTE — Progress Notes (Signed)
Patient ID: Suzanne Rich, female   DOB: May 24, 1940, 75 y.o.   MRN: [id number]     [facility]  Code Status: DNR  No Known Allergies   Chief Complaint  Patient presents with  . Medical Management of Chronic Issues    Routine Visit    HPI:  75 year old patient is seen for routine visit. She denies any concern this visit. no new concern from nursing staff.    Review of Systems:  Constitutional: Negative for fever.  HENT: Negative for headache Eyes: Negative for discharge.  Respiratory: Negative for shortness of breath.   Cardiovascular: Negative for chest pain, palpitations, leg swelling.  Gastrointestinal: Negative for heartburn, nausea, vomiting, abdominal pain Genitourinary: Negative for dysuria Musculoskeletal: Negative for back pain, fall Skin: Negative for itching, rash.  Neurological: Negative for dizziness, tingling Psychiatric/Behavioral: Negative for depression   Past Medical History:  Diagnosis Date  . CHF (congestive heart failure) (HCC)   . COPD (chronic obstructive pulmonary disease) (HCC)   . Edema   . Gout   . Hyperlipidemia   . Hypertension   . Hypothyroidism   . OP (osteoporosis)   . Stroke [facility](HCC)     Medications: Patient's Medications  New Prescriptions   No medications on file  Previous Medications   ACETAMINOPHEN (TYLENOL) 650 MG CR TABLET    Take 650 mg by mouth every 8 (eight) hours as needed for pain.   AMBULATORY NON FORMULARY MEDICATION    Medication Name: Med Pass 120 mL four times daily for nutritional support   ASPIRIN 81 MG CHEWABLE TABLET    Chew 81 mg by mouth daily.   CALCIUM-VITAMIN D (OSCAL WITH D) 500-200 MG-UNIT PER TABLET    Take 1 tablet by mouth 3 (three) times daily.   CHOLECALCIFEROL (VITAMIN D3) 1000 UNITS CAPS    Take 2,000 capsules by mouth daily. Take 1 capsule daily   FLUTICASONE-SALMETEROL (ADVAIR DISKUS) 100-50 MCG/DOSE AEPB    Inhale 1 puff into the lungs 2 (two) times daily.   LEVOTHYROXINE (SYNTHROID, LEVOTHROID) 75 MCG TABLET    Take 75 mcg by mouth daily before breakfast. For Hypothyroidism   METOPROLOL SUCCINATE (TOPROL-XL) 100 MG 24 HR TABLET    Take 100 mg by mouth daily. Take with or immediately following a meal.   ONDANSETRON (ZOFRAN) 4 MG TABLET    Take 4 mg by mouth every 6 (six) hours as needed for nausea or vomiting.    POLYETHYLENE GLYCOL (MIRALAX) PACKET    Take 17 g by mouth daily.   PROTEIN (PROCEL) POWD    Take 2 scoop by mouth 2 (two) times daily.   SIMVASTATIN (ZOCOR) 10 MG TABLET    Take 10 mg by mouth daily. For Hyperlipidemia   UNABLE TO FIND    Med Name: Magic Cup twice daily with med pass @@2  pm and 8 pm  Modified Medications   No medications on file  Discontinued Medications   No medications on file     Physical Exam:  BP 132/65   Pulse 72   Temp 98.2 F (36.8 C) (Oral)   Resp 18   Ht 5\' 6"  (1.676 m)   Wt 156 lb 6.4 oz (70.9 kg)   SpO2 94%   BMI 25.24 kg/m   Wt Readings from Last 3 Encounters:  10/21/15 156 lb 6.4 oz (70.9 kg)  09/16/15 150 lb (68 kg)  08/01/15 149 lb 12.8 oz (67.9 kg)   General- elderly female in no  acute distress Head- atraumatic, normocephalic Nose- no maxillary sinus tenderness Eyes- PERRLA, EOMI, no pallor, no icterus, no discharge Neck- no lymphadenopathy, no jvd Mouth- normal mucus membrane, edentulous Cardiovascular- normal s1,s2, no murmur Respiratory- bilateral clear to auscultation, no wheeze, no rhonchi, no crackles Abdomen- bowel sounds present, soft, non tender Musculoskeletal- able to move right UE. Right AKA. Left sided hemiparesis LUE>LLE. Trace left leg edema.  Neurological- no focal deficit Skin- warm and dry, left great toe dime sized darkened area without opening or drainage, no signs of infection Psychiatry- alert and oriented, normal mood and affect    Labs reviewed: Basic Metabolic Panel:  Recent Labs  16/11/9601/22/17 06/19/15 07/08/15  NA 140 138 135*  K 4.4 4.3 4.4  BUN 14  13 12   CREATININE 0.6 0.7 0.7   Liver Function Tests:  Recent Labs  05/21/15 06/19/15 07/08/15  AST 22 14 24   ALT 12 13 21   ALKPHOS 77 73 79   No results for input(s): LIPASE, AMYLASE in the last 8760 hours. No results for input(s): AMMONIA in the last 8760 hours. CBC:  Recent Labs  05/21/15 06/19/15 07/08/15  WBC 8.7 11.0 8.5  HGB 13.4 12.9 13.5  HCT 39 38 40  PLT 276 294 409*    Lab Results  Component Value Date   TSH 1.56 05/05/2015   Lipid Panel     Component Value Date/Time   CHOL 136 05/05/2015   TRIG 55 05/05/2015   HDL 55 05/05/2015   LDLCALC 70 05/05/2015    Assessment/Plan  Hemiplegia and hemiparesis  Continue baby aspirin and to continue to wear her left  Hand/wrist splint.   Mixed hyperlipidemia Continue zocor 10 mg qhs Lipid Panel     Component Value Date/Time   CHOL 136 05/05/2015   TRIG 55 05/05/2015   HDL 55 05/05/2015   LDLCALC 70 05/05/2015   Cerebral infarction No new signs of TIA or CVA. Continue aspirin and statin  PAD Continues to smoke. counselled on cessation. Continue aspirin and statin. Continue to monitor her darkened spot to great toe for breakdown and infection    Suzanne GroutMAHIMA Oshae Simmering, MD  [facility] [phone] (Monday-Friday 8 am - 5 pm) [phone] (afterhours)

## 2015-11-18 LAB — TSH: TSH: 2.87 u[IU]/mL (ref 0.41–5.90)

## 2015-11-18 LAB — LIPID PANEL
CHOLESTEROL: 144 mg/dL (ref 0–200)
HDL: 54 mg/dL (ref 35–70)
LDL Cholesterol: 77 mg/dL
Triglycerides: 64 mg/dL (ref 40–160)

## 2015-11-27 ENCOUNTER — Encounter: Payer: Self-pay | Admitting: Internal Medicine

## 2015-11-27 ENCOUNTER — Non-Acute Institutional Stay (SKILLED_NURSING_FACILITY): Payer: Medicare Other | Admitting: Internal Medicine

## 2015-11-27 DIAGNOSIS — F329 Major depressive disorder, single episode, unspecified: Secondary | ICD-10-CM

## 2015-11-27 DIAGNOSIS — D692 Other nonthrombocytopenic purpura: Secondary | ICD-10-CM

## 2015-11-27 DIAGNOSIS — M818 Other osteoporosis without current pathological fracture: Secondary | ICD-10-CM

## 2015-11-27 DIAGNOSIS — M810 Age-related osteoporosis without current pathological fracture: Secondary | ICD-10-CM

## 2015-11-27 NOTE — Progress Notes (Signed)
Patient ID: Suzanne Rich, female   DOB: 18-Feb-1941, 75 y.o.   MRN: [id number]     [facility]  Code Status: DNR  No Known Allergies   Chief Complaint  Patient presents with  . Medical Management of Chronic Issues    Routine Visit    HPI:  75 year old patient is seen for routine visit. She denies any concern this visit. no new concern from nursing staff.    Review of Systems:  Constitutional: Negative for fever.  HENT: Negative for headache Eyes: Negative for discharge.  Respiratory: Negative for shortness of breath.   Cardiovascular: Negative for chest pain, palpitations, leg swelling.  Gastrointestinal: Negative for heartburn, nausea, vomiting, abdominal pain Genitourinary: Negative for dysuria Musculoskeletal: Negative for back pain, fall Skin: Negative for itching, rash.  Neurological: Negative for dizziness, tingling Psychiatric/Behavioral: Negative for depression   Past Medical History:  Diagnosis Date  . CHF (congestive heart failure) (HCC)   . COPD (chronic obstructive pulmonary disease) (HCC)   . Edema   . Gout   . Hyperlipidemia   . Hypertension   . Hypothyroidism   . OP (osteoporosis)   . Stroke [facility](HCC)     Medications: Patient's Medications  New Prescriptions   No medications on file  Previous Medications   ACETAMINOPHEN (TYLENOL) 650 MG CR TABLET    Take 650 mg by mouth every 8 (eight) hours as needed for pain.   AMBULATORY NON FORMULARY MEDICATION    Medication Name: Med Pass 120 mL four times daily for nutritional support   ASPIRIN 81 MG CHEWABLE TABLET    Chew 81 mg by mouth daily.   CALCIUM-VITAMIN D (OSCAL WITH D) 500-200 MG-UNIT PER TABLET    Take 1 tablet by mouth 3 (three) times daily.   CHOLECALCIFEROL (VITAMIN D3) 1000 UNITS CAPS    Take 2,000 capsules by mouth daily. Take 1 capsule daily   FLUTICASONE-SALMETEROL (ADVAIR DISKUS) 100-50 MCG/DOSE AEPB    Inhale 1 puff into the lungs 2 (two) times daily.   LEVOTHYROXINE (SYNTHROID, LEVOTHROID) 75 MCG TABLET    Take 75 mcg by mouth daily before breakfast. For Hypothyroidism   METOPROLOL SUCCINATE (TOPROL-XL) 100 MG 24 HR TABLET    Take 100 mg by mouth daily. Take with or immediately following a meal.   ONDANSETRON (ZOFRAN) 4 MG TABLET    Take 4 mg by mouth every 6 (six) hours as needed for nausea or vomiting.    POLYETHYLENE GLYCOL (MIRALAX) PACKET    Take 17 g by mouth daily.   PROTEIN (PROCEL) POWD    Take 2 scoop by mouth 2 (two) times daily.   SIMVASTATIN (ZOCOR) 10 MG TABLET    Take 10 mg by mouth daily. For Hyperlipidemia   UNABLE TO FIND    Med Name: Magic Cup twice daily with med pass @@2  pm and 8 pm  Modified Medications   No medications on file  Discontinued Medications   No medications on file     Physical Exam:  BP 140/71   Pulse 72   Temp 99.1 F (37.3 C) (Oral)   Resp 20   Ht 5\' 6"  (1.676 m)   Wt 164 lb 3.2 oz (74.5 kg)   SpO2 98%   BMI 26.50 kg/m   Wt Readings from Last 3 Encounters:  11/27/15 164 lb 3.2 oz (74.5 kg)  10/21/15 156 lb 6.4 oz (70.9 kg)  09/16/15 150 lb (68 kg)   General- elderly female in no  acute distress Head- atraumatic, normocephalic Nose- no maxillary sinus tenderness Eyes- PERRLA, EOMI, no pallor, no icterus, no discharge Neck- no lymphadenopathy, no jvd Mouth- normal mucus membrane, edentulous Cardiovascular- normal s1,s2, no murmur Respiratory- bilateral clear to auscultation Abdomen- bowel sounds present, soft, non tender Musculoskeletal- able to move right UE. Right AKA. Left sided hemiparesis LUE>LLE. Trace left leg edema.  Neurological- no focal deficit Skin- warm and dry, left great toe dime sized darkened area without opening or drainage, no signs of infection Psychiatry- alert and oriented, normal mood and affect    Labs reviewed: Basic Metabolic Panel:  Recent Labs  56/21/30 06/19/15 07/08/15  NA 140 138 135*  K 4.4 4.3 4.4  BUN 14 13 12   CREATININE 0.6 0.7 0.7    Liver Function Tests:  Recent Labs  05/21/15 06/19/15 07/08/15  AST 22 14 24   ALT 12 13 21   ALKPHOS 77 73 79   No results for input(s): LIPASE, AMYLASE in the last 8760 hours. No results for input(s): AMMONIA in the last 8760 hours. CBC:  Recent Labs  05/21/15 06/19/15 07/08/15  WBC 8.7 11.0 8.5  HGB 13.4 12.9 13.5  HCT 39 38 40  PLT 276 294 409*    Lab Results  Component Value Date   TSH 2.87 11/18/2015   Lipid Panel     Component Value Date/Time   CHOL 144 11/18/2015   TRIG 64 11/18/2015   HDL 54 11/18/2015   LDLCALC 77 11/18/2015    Assessment/Plan  Age related osteoporosis No fall or fracture reported. Continue calcium supplement and vitamin d and take fall precautions  Non thrombocytopenic purpura Monitor for skin integrity. Continue skin care.   Major depression Stable, currently off all meds, monitor clinically   Oneal Grout, MD Internal Medicine [facility] [address] [place], [place] [address] Cell Phone (Monday-Friday 8 am - 5 pm): [phone] On Call: [phone] and follow prompts after 5 pm and on weekends Office Phone: [phone] Office Fax: [phone]

## 2015-12-24 ENCOUNTER — Encounter: Payer: Self-pay | Admitting: Internal Medicine

## 2015-12-24 ENCOUNTER — Non-Acute Institutional Stay (SKILLED_NURSING_FACILITY): Payer: Medicare Other | Admitting: Internal Medicine

## 2015-12-24 DIAGNOSIS — I10 Essential (primary) hypertension: Secondary | ICD-10-CM

## 2015-12-24 DIAGNOSIS — D692 Other nonthrombocytopenic purpura: Secondary | ICD-10-CM | POA: Diagnosis not present

## 2015-12-24 DIAGNOSIS — E440 Moderate protein-calorie malnutrition: Secondary | ICD-10-CM | POA: Diagnosis not present

## 2015-12-24 DIAGNOSIS — M810 Age-related osteoporosis without current pathological fracture: Secondary | ICD-10-CM | POA: Diagnosis not present

## 2015-12-24 DIAGNOSIS — E559 Vitamin D deficiency, unspecified: Secondary | ICD-10-CM | POA: Diagnosis not present

## 2015-12-24 DIAGNOSIS — Z0000 Encounter for general adult medical examination without abnormal findings: Secondary | ICD-10-CM

## 2015-12-24 DIAGNOSIS — I739 Peripheral vascular disease, unspecified: Secondary | ICD-10-CM

## 2015-12-24 DIAGNOSIS — K5909 Other constipation: Secondary | ICD-10-CM | POA: Diagnosis not present

## 2015-12-24 NOTE — Progress Notes (Signed)
Patient ID: Suzanne Rich, female   DOB: 1940-07-05, 75 y.o.   MRN: [id number]     [facility]  Code Status: DNR  No Known Allergies   Chief Complaint  Patient presents with  . Annual Exam    Annual Visit    HPI:  75 year old patient is seen for annual visit. She denies any concern this visit. She is out of bed 2-3 times a week. She continues to smoke her cigarette. No fall has been reported. No new skin concern. She has been at her baseline. No new concern from nursing staff. She is compliant with her medication. She feeds herself. She needs assistance with her other ADLs.   Review of Systems:  Constitutional: Negative for fever, chills.  HENT: Negative for headache, congestion Eyes: Negative for discharge, eye pain.  Respiratory: Negative for shortness of breath, cough, sore throat.   Cardiovascular: Negative for chest pain, palpitations, leg swelling.  Gastrointestinal: Negative for heartburn, nausea, vomiting, abdominal pain Genitourinary: Negative for dysuria Musculoskeletal: Negative for back pain, fall Skin: Negative for itching, rash.  Neurological: Negative for dizziness, tingling Psychiatric/Behavioral: Negative for depression   Past Medical History:  Diagnosis Date  . CHF (congestive heart failure) (HCC)   . COPD (chronic obstructive pulmonary disease) (HCC)   . Edema   . Gout   . Hyperlipidemia   . Hypertension   . Hypothyroidism   . OP (osteoporosis)   . Stroke [facility])     Medications: Patient's Medications  New Prescriptions   No medications on file  Previous Medications   ACETAMINOPHEN (TYLENOL) 650 MG CR TABLET    Take 650 mg by mouth every 8 (eight) hours as needed for pain.   AMBULATORY NON FORMULARY MEDICATION    Medication Name: Med Pass 120 mL 2 times daily for nutritional support   ASPIRIN 81 MG CHEWABLE TABLET    Chew 81 mg by mouth daily.   CALCIUM-VITAMIN D (OSCAL WITH D) 500-200 MG-UNIT PER TABLET    Take 1 tablet  by mouth 3 (three) times daily.   CHOLECALCIFEROL (VITAMIN D3) 1000 UNITS CAPS    Take 2,000 capsules by mouth daily. Take 1 capsule daily   FLUTICASONE-SALMETEROL (ADVAIR DISKUS) 100-50 MCG/DOSE AEPB    Inhale 1 puff into the lungs 2 (two) times daily.   LEVOTHYROXINE (SYNTHROID, LEVOTHROID) 75 MCG TABLET    Take 75 mcg by mouth daily before breakfast. For Hypothyroidism   METOPROLOL SUCCINATE (TOPROL-XL) 100 MG 24 HR TABLET    Take 100 mg by mouth daily. Take with or immediately following a meal.   ONDANSETRON (ZOFRAN) 4 MG TABLET    Take 4 mg by mouth every 6 (six) hours as needed for nausea or vomiting.    POLYETHYLENE GLYCOL (MIRALAX) PACKET    Take 17 g by mouth daily.   SIMVASTATIN (ZOCOR) 10 MG TABLET    Take 10 mg by mouth daily. For Hyperlipidemia  Modified Medications   No medications on file  Discontinued Medications   PROTEIN (PROCEL) POWD    Take 2 scoop by mouth 2 (two) times daily.   UNABLE TO FIND    Med Name: Magic Cup twice daily with med pass @@2  pm and 8 pm     Physical Exam:  BP (!) 144/73   Pulse 60   Temp 98.4 F (36.9 C) (Oral)   Resp 18   Ht 5\' 6"  (1.676 m)   Wt 163 lb 6.4 oz (74.1 kg)  SpO2 97%   BMI 26.37 kg/m   Wt Readings from Last 3 Encounters:  12/24/15 163 lb 6.4 oz (74.1 kg)  11/27/15 164 lb 3.2 oz (74.5 kg)  10/21/15 156 lb 6.4 oz (70.9 kg)   General- elderly female in no acute distress Head- atraumatic, normocephalic Nose- no maxillary sinus tenderness, no nasal discharge Eyes- PERRLA, EOMI, no pallor, no icterus, no discharge Neck- no lymphadenopathy, no jvd Mouth- normal mucus membrane, edentulous Cardiovascular- normal s1,s2, no murmur Respiratory- bilateral clear to auscultation, no wheeze, rhonchi and crackles Abdomen- bowel sounds present, soft, non tender Musculoskeletal- able to move right UE. Right AKA. Left sided hemiparesis LUE>LLE. Trace left leg edema.  Neurological- left sided hemiparesis, alert and oriented to person,  place and time Skin- warm and dry, intact, left great toe dime sized darkened area without opening or drainage, no signs of infection, has senile purpura Psychiatry- normal mood and affect    Labs reviewed: Basic Metabolic Panel:  Recent Labs  40/98/1103/22/17 06/19/15 07/08/15  NA 140 138 135*  K 4.4 4.3 4.4  BUN 14 13 12   CREATININE 0.6 0.7 0.7   Liver Function Tests:  Recent Labs  05/21/15 06/19/15 07/08/15  AST 22 14 24   ALT 12 13 21   ALKPHOS 77 73 79   No results for input(s): LIPASE, AMYLASE in the last 8760 hours. No results for input(s): AMMONIA in the last 8760 hours. CBC:  Recent Labs  05/21/15 06/19/15 07/08/15  WBC 8.7 11.0 8.5  HGB 13.4 12.9 13.5  HCT 39 38 40  PLT 276 294 409*    Lab Results  Component Value Date   TSH 2.87 11/18/2015   Lipid Panel     Component Value Date/Time   CHOL 144 11/18/2015   TRIG 64 11/18/2015   HDL 54 11/18/2015   LDLCALC 77 11/18/2015    Assessment/Plan  Hypothyroidism Reviewed tsh. Continue levothyroxine 75 mcg daily  Vitamin d def Continue vitamin d 2000 u daily  Protein calorie malnutrition Monitor po intake. Continue nutritional supplement  Annual exam Influenza vaccine on 11/21/15. Pneumococcal vaccine 11/16/13. Will need her influenza vaccine. On vitamin and calcium supplement to help with her bones. Fall precautions in place. Assistance with her ADLs at the SNF.  Age related osteoporosis No fall or fracture reported. Continue calcium supplement and vitamin d and take fall precautions  Hypertension Stable BP reading on review. Continue metoprolol and aspirin  Non thrombocytopenic purpura Monitor for skin integrity. Continue skin care   Chronic constipation Continue miralax daily  PAD S/p right AKA. Continue baby aspirin   Oneal GroutMAHIMA Raymie Trani, MD Internal Medicine [facility] [address] [place], [place] [address] Cell Phone (Monday-Friday 8 am - 5 pm):  [phone] On Call: [phone] and follow prompts after 5 pm and on weekends Office Phone: [phone] Office Fax: [phone]

## 2016-01-06 LAB — BASIC METABOLIC PANEL
BUN: 13 mg/dL (ref 4–21)
Creatinine: 0.6 mg/dL (ref 0.5–1.1)
Glucose: 89 mg/dL
Potassium: 4.3 mmol/L (ref 3.4–5.3)
SODIUM: 141 mmol/L (ref 137–147)

## 2016-01-06 LAB — CBC AND DIFFERENTIAL
HEMATOCRIT: 39 % (ref 36–46)
Hemoglobin: 13.1 g/dL (ref 12.0–16.0)
PLATELETS: 283 10*3/uL (ref 150–399)
WBC: 9.9 10^3/mL

## 2016-01-06 LAB — HEPATIC FUNCTION PANEL
ALK PHOS: 78 U/L (ref 25–125)
ALT: 13 U/L (ref 7–35)
AST: 14 U/L (ref 13–35)
Bilirubin, Total: 0.5 mg/dL

## 2016-01-20 ENCOUNTER — Non-Acute Institutional Stay (SKILLED_NURSING_FACILITY): Payer: Medicare Other | Admitting: Internal Medicine

## 2016-01-20 ENCOUNTER — Encounter: Payer: Self-pay | Admitting: Internal Medicine

## 2016-01-20 DIAGNOSIS — R110 Nausea: Secondary | ICD-10-CM | POA: Diagnosis not present

## 2016-01-20 DIAGNOSIS — J439 Emphysema, unspecified: Secondary | ICD-10-CM | POA: Diagnosis not present

## 2016-01-20 DIAGNOSIS — E038 Other specified hypothyroidism: Secondary | ICD-10-CM | POA: Diagnosis not present

## 2016-01-20 NOTE — Progress Notes (Signed)
Patient ID: Suzanne Rich, female   DOB: 05-Mar-1940, 75 y.o.   MRN: [id number]     [facility]  Code Status: DNR  No Known Allergies   Chief Complaint  Patient presents with  . Medical Management of Chronic Issues    Routine Visit    HPI:  75 year old patient is seen for routine visit. She denies any concern this visit.   Review of Systems:  Constitutional: Negative for fever.  HENT: Negative for headache Respiratory: Negative for shortness of breath.   Cardiovascular: Negative for chest pain, palpitations Gastrointestinal: Negative for heartburn, nausea, vomiting, abdominal pain Genitourinary: Negative for dysuria Musculoskeletal: Negative for back pain, fall Skin: Negative for itching, rash.  Neurological: Negative for dizziness Psychiatric/Behavioral: Negative for depression   Past Medical History:  Diagnosis Date  . CHF (congestive heart failure) (HCC)   . COPD (chronic obstructive pulmonary disease) (HCC)   . Edema   . Gout   . Hyperlipidemia   . Hypertension   . Hypothyroidism   . OP (osteoporosis)   . Stroke [facility](HCC)     Medications: Patient's Medications  New Prescriptions   No medications on file  Previous Medications   ACETAMINOPHEN (TYLENOL) 650 MG CR TABLET    Take 650 mg by mouth every 8 (eight) hours as needed for pain.   AMBULATORY NON FORMULARY MEDICATION    Medication Name: Med Pass 120 mL 2 times daily for nutritional support   ASPIRIN 81 MG CHEWABLE TABLET    Chew 81 mg by mouth daily.   CALCIUM-VITAMIN D (OSCAL WITH D) 500-200 MG-UNIT PER TABLET    Take 1 tablet by mouth 3 (three) times daily.   CHOLECALCIFEROL (VITAMIN D3) 1000 UNITS CAPS    Take 2,000 capsules by mouth daily. Take 1 capsule daily   FLUTICASONE-SALMETEROL (ADVAIR DISKUS) 100-50 MCG/DOSE AEPB    Inhale 1 puff into the lungs 2 (two) times daily.   LEVOTHYROXINE (SYNTHROID, LEVOTHROID) 75 MCG TABLET    Take 75 mcg by mouth daily before breakfast. For  Hypothyroidism   METOPROLOL SUCCINATE (TOPROL-XL) 100 MG 24 HR TABLET    Take 100 mg by mouth daily. Take with or immediately following a meal.   ONDANSETRON (ZOFRAN) 4 MG TABLET    Take 4 mg by mouth every 6 (six) hours as needed for nausea or vomiting.    POLYETHYLENE GLYCOL (MIRALAX) PACKET    Take 17 g by mouth daily.   SIMVASTATIN (ZOCOR) 10 MG TABLET    Take 10 mg by mouth daily. For Hyperlipidemia  Modified Medications   No medications on file  Discontinued Medications   No medications on file     Physical Exam:  BP (!) 146/79   Pulse 67   Temp 98 F (36.7 C) (Oral)   Resp 18   Ht 5\' 6"  (1.676 m)   Wt 168 lb (76.2 kg)   SpO2 97%   BMI 27.12 kg/m   Wt Readings from Last 3 Encounters:  01/20/16 168 lb (76.2 kg)  12/24/15 163 lb 6.4 oz (74.1 kg)  11/27/15 164 lb 3.2 oz (74.5 kg)   General- elderly female in no acute distress Head- atraumatic, normocephalic Neck- no lymphadenopathy Mouth- normal mucus membrane, edentulous Cardiovascular- normal s1,s2, no murmur Respiratory- bilateral clear to auscultation Abdomen- bowel sounds present, soft, non tender Musculoskeletal- able to move right UE. Right AKA. Left sided hemiparesis LUE>LLE. Trace left leg edema.  Neurological- no focal deficit Skin- warm and dry,  left great toe dime sized darkened area without opening or drainage, no signs of infection Psychiatry- alert and oriented, normal mood and affect    Labs reviewed: Basic Metabolic Panel:  Recent Labs  40/98/1104/20/17 07/08/15 01/06/16  NA 138 135* 141  K 4.3 4.4 4.3  BUN 13 12 13   CREATININE 0.7 0.7 0.6   Liver Function Tests:  Recent Labs  06/19/15 07/08/15 01/06/16  AST 14 24 14   ALT 13 21 13   ALKPHOS 73 79 78   No results for input(s): LIPASE, AMYLASE in the last 8760 hours. No results for input(s): AMMONIA in the last 8760 hours. CBC:  Recent Labs  06/19/15 07/08/15 01/06/16  WBC 11.0 8.5 9.9  HGB 12.9 13.5 13.1  HCT 38 40 39  PLT 294 409* 283      Lab Results  Component Value Date   TSH 2.87 11/18/2015   Lipid Panel     Component Value Date/Time   CHOL 144 11/18/2015   TRIG 64 11/18/2015   HDL 54 11/18/2015   LDLCALC 77 11/18/2015    Assessment/Plan  Hypothyroidism Chronic, stable. Continue levothyroxine 75 mcg daily  Copd Stable breathing, continue advair  Nausea Resolved. Discontinue zofran prn order   Oneal GroutMAHIMA Darletta Noblett, MD Internal Medicine [facility] [address] [place], [place] [address] Cell Phone (Monday-Friday 8 am - 5 pm): [phone] On Call: [phone] and follow prompts after 5 pm and on weekends Office Phone: [phone] Office Fax: [phone]

## 2016-02-13 ENCOUNTER — Non-Acute Institutional Stay (SKILLED_NURSING_FACILITY): Payer: Medicare Other | Admitting: Internal Medicine

## 2016-02-13 ENCOUNTER — Encounter: Payer: Self-pay | Admitting: Internal Medicine

## 2016-02-13 DIAGNOSIS — I10 Essential (primary) hypertension: Secondary | ICD-10-CM

## 2016-02-13 DIAGNOSIS — J439 Emphysema, unspecified: Secondary | ICD-10-CM | POA: Diagnosis not present

## 2016-02-13 DIAGNOSIS — E782 Mixed hyperlipidemia: Secondary | ICD-10-CM | POA: Diagnosis not present

## 2016-02-13 DIAGNOSIS — K5909 Other constipation: Secondary | ICD-10-CM

## 2016-02-13 NOTE — Progress Notes (Signed)
Patient ID: Suzanne Rich, female   DOB: 1940/03/08, 75 y.o.   MRN: [id number]     [facility]  Code Status: DNR  No Known Allergies   Chief Complaint  Patient presents with  . Medical Management of Chronic Issues    Routine Visit    HPI:  75 year old patient is seen for routine visit. She denies any concern this visit. She has been at her baseline per nursing staff. No falls reported. No new skin concerns. She is compliant with her medications. She is mostly in bed these days and gets out of bed maybe once a week as per nursing. She has also cut down her smoking to one cigarette a week. BP reading have been elevated on review.  Review of Systems:  Constitutional: Negative for fever.  HENT: Negative for headache Respiratory: Negative for shortness of breath.   Cardiovascular: Negative for chest pain, palpitations Gastrointestinal: Negative for heartburn, nausea, vomiting, abdominal pain Genitourinary: Negative for dysuria Musculoskeletal: Negative for back pain, fall Skin: Negative for itching, rash.  Neurological: Negative for dizziness Psychiatric/Behavioral: Negative for depression   Past Medical History:  Diagnosis Date  . CHF (congestive heart failure) (HCC)   . COPD (chronic obstructive pulmonary disease) (HCC)   . Edema   . Gout   . Hyperlipidemia   . Hypertension   . Hypothyroidism   . OP (osteoporosis)   . Stroke [facility](HCC)     Medications: Patient's Medications  New Prescriptions   No medications on file  Previous Medications   ACETAMINOPHEN (TYLENOL) 650 MG CR TABLET    Take 650 mg by mouth every 6 (six) hours as needed for pain.    AMBULATORY NON FORMULARY MEDICATION    Medication Name: Med Pass 120 mL 2 times daily for nutritional support   ASPIRIN 81 MG CHEWABLE TABLET    Chew 81 mg by mouth daily.   CALCIUM-VITAMIN D (OSCAL WITH D) 500-200 MG-UNIT PER TABLET    Take 1 tablet by mouth 3 (three) times daily.   CHOLECALCIFEROL  (VITAMIN D3) 1000 UNITS CAPS    Take 2,000 capsules by mouth daily. Take 1 capsule daily   FLUTICASONE-SALMETEROL (ADVAIR DISKUS) 100-50 MCG/DOSE AEPB    Inhale 1 puff into the lungs 2 (two) times daily.   GUAIFENESIN (MUCINEX) 600 MG 12 HR TABLET    Take 600 mg by mouth 2 (two) times daily. Stop date 02/17/16   HYDROCHLOROTHIAZIDE (HYDRODIURIL) 12.5 MG TABLET    Take 12.5 mg by mouth daily.   LEVOTHYROXINE (SYNTHROID, LEVOTHROID) 75 MCG TABLET    Take 75 mcg by mouth daily before breakfast. For Hypothyroidism   METOPROLOL SUCCINATE (TOPROL-XL) 100 MG 24 HR TABLET    Take 100 mg by mouth daily. Take with or immediately following a meal.   POLYETHYLENE GLYCOL (MIRALAX) PACKET    Take 17 g by mouth daily.   SIMVASTATIN (ZOCOR) 10 MG TABLET    Take 10 mg by mouth daily. For Hyperlipidemia  Modified Medications   No medications on file  Discontinued Medications   ONDANSETRON (ZOFRAN) 4 MG TABLET    Take 4 mg by mouth every 6 (six) hours as needed for nausea or vomiting.      Physical Exam:  BP (!) 146/69   Pulse 67   Temp 97.7 F (36.5 C) (Oral)   Resp 20   Ht 5\' 6"  (1.676 m)   Wt 172 lb (78 kg)   SpO2 97%   BMI  27.76 kg/m   Wt Readings from Last 3 Encounters:  02/13/16 172 lb (78 kg)  01/20/16 168 lb (76.2 kg)  12/24/15 163 lb 6.4 oz (74.1 kg)   General- elderly female in no acute distress Head- atraumatic, normocephalic Neck- no lymphadenopathy Mouth- normal mucus membrane, edentulous Cardiovascular- normal s1,s2, no murmur Respiratory- bilateral clear to auscultation Abdomen- bowel sounds present, soft, non tender Musculoskeletal- able to move right UE. Right AKA. Left sided hemiparesis LUE>LLE. Trace left leg edema.  Neurological- no focal deficit Skin- warm and dry Psychiatry- alert and oriented to person and place, normal mood and affect    Labs reviewed: Basic Metabolic Panel:  Recent Labs  16/11/9602/20/17 07/08/15 01/06/16  NA 138 135* 141  K 4.3 4.4 4.3  BUN 13 12  13   CREATININE 0.7 0.7 0.6   Liver Function Tests:  Recent Labs  06/19/15 07/08/15 01/06/16  AST 14 24 14   ALT 13 21 13   ALKPHOS 73 79 78   No results for input(s): LIPASE, AMYLASE in the last 8760 hours. No results for input(s): AMMONIA in the last 8760 hours. CBC:  Recent Labs  06/19/15 07/08/15 01/06/16  WBC 11.0 8.5 9.9  HGB 12.9 13.5 13.1  HCT 38 40 39  PLT 294 409* 283    Lab Results  Component Value Date   TSH 2.87 11/18/2015   Lipid Panel     Component Value Date/Time   CHOL 144 11/18/2015   TRIG 64 11/18/2015   HDL 54 11/18/2015   LDLCALC 77 11/18/2015    Assessment/Plan  Hypertension Elevated blood pressure readings on review. Start patient on hydrochlorothiazide 12.5 mg daily. Continue metoprolol succinate 100 mg daily. Monitor blood pressure on daily basis. Review and adjust medicine if needed for blood pressure goal less than 140/90. Check BMP  COPD Breathing has been stable. No recent exacerbation. Continue Advair twice a day. Counseled on smoking cessation.  Hyperlipidemia LDL at goal. Continue simvastatin 10 mg daily.   Chronic constipation Stable at present continue her MiraLAX.   Oneal GroutMAHIMA Zamar Odwyer, MD Internal Medicine [facility] [address] [place], [place] [address] Cell Phone (Monday-Friday 8 am - 5 pm): [phone] On Call: [phone] and follow prompts after 5 pm and on weekends Office Phone: [phone] Office Fax: [phone]

## 2016-03-08 LAB — BASIC METABOLIC PANEL
BUN: 14 mg/dL (ref 4–21)
CREATININE: 0.6 mg/dL (ref 0.5–1.1)
Glucose: 87 mg/dL
Potassium: 4 mmol/L (ref 3.4–5.3)
Sodium: 142 mmol/L (ref 137–147)

## 2016-03-10 ENCOUNTER — Non-Acute Institutional Stay (SKILLED_NURSING_FACILITY): Payer: Medicare Other | Admitting: Internal Medicine

## 2016-03-10 ENCOUNTER — Encounter: Payer: Self-pay | Admitting: Internal Medicine

## 2016-03-10 DIAGNOSIS — I739 Peripheral vascular disease, unspecified: Secondary | ICD-10-CM

## 2016-03-10 DIAGNOSIS — M818 Other osteoporosis without current pathological fracture: Secondary | ICD-10-CM

## 2016-03-10 DIAGNOSIS — E440 Moderate protein-calorie malnutrition: Secondary | ICD-10-CM

## 2016-03-10 NOTE — Progress Notes (Signed)
Patient ID: Suzanne Rich, female   DOB: 07-Apr-1940, 76 y.o.   MRN: [id number]     [facility]  Code Status: DNR  No Known Allergies   Chief Complaint  Patient presents with  . Medical Management of Chronic Issues    Routine Visit     HPI:  76 year old patient is seen for routine visit. She denies any concern this visit.   Review of Systems:  Constitutional: Negative for fever.  HENT: Negative for headache Respiratory: Negative for shortness of breath.   Cardiovascular: Negative for chest pain Gastrointestinal: Negative for heartburn, nausea, vomiting, abdominal pain Genitourinary: Negative for dysuria Musculoskeletal: Negative for back pain, fall Skin: Negative for itching, rash.  Neurological: Negative for dizziness Psychiatric/Behavioral: Negative for depression   Past Medical History:  Diagnosis Date  . CHF (congestive heart failure) (HCC)   . COPD (chronic obstructive pulmonary disease) (HCC)   . Edema   . Gout   . Hyperlipidemia   . Hypertension   . Hypothyroidism   . OP (osteoporosis)   . Stroke [facility])     Medications: Patient's Medications  New Prescriptions   No medications on file  Previous Medications   ACETAMINOPHEN (TYLENOL) 650 MG CR TABLET    Take 650 mg by mouth every 6 (six) hours as needed for pain.    AMBULATORY NON FORMULARY MEDICATION    Medication Name: Med Pass 120 mL 2 times daily for nutritional support   ASPIRIN 81 MG CHEWABLE TABLET    Chew 81 mg by mouth daily.   CALCIUM-VITAMIN D (OSCAL WITH D) 500-200 MG-UNIT PER TABLET    Take 1 tablet by mouth 3 (three) times daily.   CHOLECALCIFEROL (VITAMIN D3) 1000 UNITS CAPS    Take 2,000 capsules by mouth daily. Take 1 capsule daily   FLUTICASONE-SALMETEROL (ADVAIR DISKUS) 100-50 MCG/DOSE AEPB    Inhale 1 puff into the lungs 2 (two) times daily.   HYDROCHLOROTHIAZIDE (HYDRODIURIL) 12.5 MG TABLET    Take 12.5 mg by mouth daily.   LEVOTHYROXINE (SYNTHROID, LEVOTHROID)  75 MCG TABLET    Take 75 mcg by mouth daily before breakfast. For Hypothyroidism   METOPROLOL SUCCINATE (TOPROL-XL) 100 MG 24 HR TABLET    Take 100 mg by mouth daily. Take with or immediately following a meal.   POLYETHYLENE GLYCOL (MIRALAX) PACKET    Take 17 g by mouth daily.   SIMVASTATIN (ZOCOR) 10 MG TABLET    Take 10 mg by mouth daily. For Hyperlipidemia  Modified Medications   No medications on file  Discontinued Medications   GUAIFENESIN (MUCINEX) 600 MG 12 HR TABLET    Take 600 mg by mouth 2 (two) times daily. Stop date 02/17/16     Physical Exam:  BP 123/79   Pulse 73   Temp 97.5 F (36.4 C) (Oral)   Resp 18   Ht 5\' 6"  (1.676 m)   Wt 174 lb 6.4 oz (79.1 kg)   SpO2 96%   BMI 28.15 kg/m   Wt Readings from Last 3 Encounters:  03/10/16 174 lb 6.4 oz (79.1 kg)  02/13/16 172 lb (78 kg)  01/20/16 168 lb (76.2 kg)   General- 76 elderly female in no acute distress Head- atraumatic, normocephalic Neck- no lymphadenopathy Mouth- normal mucus membrane, edentulous Cardiovascular- normal s1,s2, no murmur Respiratory- bilateral clear to auscultation Abdomen- bowel sounds present, soft, non tender Musculoskeletal- able to move right UE. Right AKA. Left sided hemiparesis LUE>LLE. Trace left leg edema.  Neurological- no focal deficit Skin- warm and dry Psychiatry- alert and oriented to person and place    Labs reviewed: Basic Metabolic Panel:  Recent Labs  16/11/9602/20/17 07/08/15 01/06/16  NA 138 135* 141  K 4.3 4.4 4.3  BUN 13 12 13   CREATININE 0.7 0.7 0.6   Liver Function Tests:  Recent Labs  06/19/15 07/08/15 01/06/16  AST 14 24 14   ALT 13 21 13   ALKPHOS 73 79 78   No results for input(s): LIPASE, AMYLASE in the last 8760 hours. No results for input(s): AMMONIA in the last 8760 hours. CBC:  Recent Labs  06/19/15 07/08/15 01/06/16  WBC 11.0 8.5 9.9  HGB 12.9 13.5 13.1  HCT 38 40 39  PLT 294 409* 283    Lab Results  Component Value Date   TSH 2.87 11/18/2015     Lipid Panel     Component Value Date/Time   CHOL 144 11/18/2015   TRIG 64 11/18/2015   HDL 54 11/18/2015   LDLCALC 77 11/18/2015    Assessment/Plan  Osteoporosis No fall or pathological fracture reported. Continue vitamin d and calcium supplement  Protein calorie malnutrition Monitor weight, RD following, continue medpass supplement  PAD S/p right AKA. Continue aspirin.    Oneal GroutMAHIMA Nathanyl Andujo, MD Internal Medicine [facility] [address] [place], [place] [address] Cell Phone (Monday-Friday 8 am - 5 pm): [phone] On Call: [phone] and follow prompts after 5 pm and on weekends Office Phone: [phone] Office Fax: [phone]

## 2016-04-04 DIAGNOSIS — R9389 Abnormal findings on diagnostic imaging of other specified body structures: Secondary | ICD-10-CM | POA: Insufficient documentation

## 2016-04-13 LAB — LIPID PANEL
CHOLESTEROL: 131 mg/dL (ref 0–200)
HDL: 48 mg/dL (ref 35–70)
LDL Cholesterol: 72 mg/dL
Triglycerides: 60 mg/dL (ref 40–160)

## 2016-04-13 LAB — TSH: TSH: 3.53 u[IU]/mL (ref 0.41–5.90)

## 2016-04-13 LAB — HEMOGLOBIN A1C: HEMOGLOBIN A1C: 5.6

## 2016-06-16 ENCOUNTER — Non-Acute Institutional Stay (SKILLED_NURSING_FACILITY): Payer: Medicare Other | Admitting: Internal Medicine

## 2016-06-16 ENCOUNTER — Encounter: Payer: Self-pay | Admitting: Internal Medicine

## 2016-06-16 DIAGNOSIS — Z720 Tobacco use: Secondary | ICD-10-CM | POA: Diagnosis not present

## 2016-06-16 DIAGNOSIS — E782 Mixed hyperlipidemia: Secondary | ICD-10-CM

## 2016-06-16 DIAGNOSIS — I110 Hypertensive heart disease with heart failure: Secondary | ICD-10-CM | POA: Diagnosis not present

## 2016-06-16 DIAGNOSIS — J439 Emphysema, unspecified: Secondary | ICD-10-CM

## 2016-06-16 NOTE — Progress Notes (Signed)
Patient ID: Suzanne Rich, female   DOB: 21-Dec-1940, 76 y.o.   MRN: [id number]     [facility]  Code Status: DNR  No Known Allergies   Chief Complaint  Patient presents with  . Medical Management of Chronic Issues    Routine Visit     HPI:  76 year old patient is seen for routine visit. She complaints of cough and has not been able to cough it up.   Review of Systems:  Constitutional: Negative for fever.  HENT: Negative for headache Respiratory: Negative for shortness of breath.   Cardiovascular: Negative for chest pain Gastrointestinal: Negative for heartburn, nausea, vomiting, abdominal pain Genitourinary: Negative for dysuria Musculoskeletal: Negative for back pain, fall Skin: Negative for itching, rash.  Neurological: Negative for dizziness Psychiatric/Behavioral: Negative for depression   Past Medical History:  Diagnosis Date  . CHF (congestive heart failure) (HCC)   . COPD (chronic obstructive pulmonary disease) (HCC)   . Edema   . Gout   . Hyperlipidemia   . Hypertension   . Hypothyroidism   . OP (osteoporosis)   . Stroke [facility])     Medications: Patient's Medications  New Prescriptions   No medications on file  Previous Medications   ACETAMINOPHEN (TYLENOL) 650 MG CR TABLET    Take 650 mg by mouth every 6 (six) hours as needed for pain.    AMBULATORY NON FORMULARY MEDICATION    Medication Name: Med Pass 120 mL 2 times daily for nutritional support   ASPIRIN 81 MG CHEWABLE TABLET    Chew 81 mg by mouth daily.   CALCIUM-VITAMIN D (OSCAL WITH D) 500-200 MG-UNIT PER TABLET    Take 1 tablet by mouth 3 (three) times daily.   CHOLECALCIFEROL (VITAMIN D3) 1000 UNITS CAPS    Take 2,000 capsules by mouth daily. Take 1 capsule daily   FLUTICASONE-SALMETEROL (ADVAIR DISKUS) 100-50 MCG/DOSE AEPB    Inhale 1 puff into the lungs 2 (two) times daily.   HYDROCHLOROTHIAZIDE (HYDRODIURIL) 12.5 MG TABLET    Take 12.5 mg by mouth daily.   LEVOTHYROXINE (SYNTHROID, LEVOTHROID) 75 MCG TABLET    Take 75 mcg by mouth daily before breakfast. For Hypothyroidism   METOPROLOL SUCCINATE (TOPROL-XL) 100 MG 24 HR TABLET    Take 100 mg by mouth daily. Take with or immediately following a meal.   POLYETHYLENE GLYCOL (MIRALAX) PACKET    Take 17 g by mouth daily.   SIMVASTATIN (ZOCOR) 10 MG TABLET    Take 10 mg by mouth daily. For Hyperlipidemia  Modified Medications   No medications on file  Discontinued Medications   No medications on file     Physical Exam:  BP 136/80   Pulse 74   Temp 97.7 F (36.5 C) (Oral)   Resp 18   Ht  (1.676 m)   Wt 182 lb 12.8 oz (82.9 kg)   SpO2 98%   BMI 29.50 kg/m   Wt Readings from Last 3 Encounters:  06/16/16 182 lb 12.8 oz (82.9 kg)  03/10/16 174 lb 6.4 oz (79.1 kg)  02/13/16 172 lb (78 kg)   General- elderly female in no acute distress Head- atraumatic, normocephalic Neck- no lymphadenopathy Mouth- normal mucus membrane, edentulous Cardiovascular- normal s1,s2, no murmur Respiratory- bilateral clear to auscultation, wheezing + Abdomen- bowel sounds present, soft, non tender Musculoskeletal- able to move right UE. Right AKA. Left sided hemiparesis LUE>LLE.  Neurological- no focal deficit Skin- warm and dry Psychiatry- alert and oriented  to person and place    Labs reviewed: Basic Metabolic Panel:  Recent Labs  60/45/40 01/06/16 03/08/16  NA 135* 141 142  K 4.4 4.3 4.0  BUN CREATININE 0.7 0.6 0.6   Liver Function Tests:  Recent Labs  06/19/15 07/08/15 01/06/16  AST ALT ALKPHOS 73 79 78   No results for input(s): LIPASE, AMYLASE in the last 8760 hours. No results for input(s): AMMONIA in the last 8760 hours. CBC:  Recent Labs  06/19/15 07/08/15 01/06/16  WBC 11.0 8.5 9.9  HGB 12.9 13.5 13.1  HCT 38 40 39  PLT 294 409* 283    Lab Results  Component Value Date   TSH 3.53 04/13/2016   Lipid Panel     Component Value  Date/Time   CHOL 131 04/13/2016   TRIG 60 04/13/2016   HDL 48 04/13/2016   LDLCALC 72 04/13/2016    Assessment/Plan  Hyperlipidemia Continue simvastatin 10 mg daily.  Lipid Panel     Component Value Date/Time   CHOL 131 04/13/2016   TRIG 60 04/13/2016   HDL 48 04/13/2016   LDLCALC 72 04/13/2016   Pulmonary emphysema Currently on advair bid. Add duoneb qid x 1 week, then q8h prn and monitor. Encouraged to stop smoking  Tobacco user Continues to smoke, understands that this can worsen her COPD and PAD. counselled on cessation  Hypertensive heart disease with heart failure Continue metoprolol and hctz, monitor BMP    Oneal Grout, MD Internal Medicine [facility] [address] [place], [place] [address] Cell Phone (Monday-Friday 8 am - 5 pm): [phone] On Call: [phone] and follow prompts after 5 pm and on weekends Office Phone: [phone] Office Fax: [phone]

## 2016-07-16 ENCOUNTER — Non-Acute Institutional Stay (SKILLED_NURSING_FACILITY): Payer: Medicare Other

## 2016-07-16 DIAGNOSIS — Z0000 Encounter for general adult medical examination without abnormal findings: Secondary | ICD-10-CM

## 2016-07-16 NOTE — Progress Notes (Signed)
Quick Notes   Health Maintenance: PNA 23 and flu due , ordered.     Abnormal Screen: 6- CIT:8     Patient Concerns: None     Nurse Concerns: None

## 2016-07-16 NOTE — Patient Instructions (Addendum)
Suzanne Rich , Thank you for taking time to come for your Medicare Wellness Visit. I appreciate your ongoing commitment to your health goals. Please review the following plan we discussed and let me know if I can assist you in the future.   Screening recommendations/referrals: Colonoscopy up to date, over age 78. Mammogram up to date, over age 78 Bone Density up to date Recommended yearly ophthalmology/optometry visit for glaucoma screening and checkup Recommended yearly dental visit for hygiene and checkup  Vaccinations: Influenza vaccine order put in  Pneumococcal vaccine due. Ordered Tdap vaccine not in records Shingles vaccine not in records  Advanced directives: In chart  Conditions/risks identified: None  Next appointment: none upcoming   Preventive Care 65 Years and Older, Female Preventive care refers to lifestyle choices and visits with your health care provider that can promote health and wellness. What does preventive care include?  A yearly physical exam. This is also called an annual well check.  Dental exams once or twice a year.  Routine eye exams. Ask your health care provider how often you should have your eyes checked.  Personal lifestyle choices, including:  Daily care of your teeth and gums.  Regular physical activity.  Eating a healthy diet.  Avoiding tobacco and drug use.  Limiting alcohol use.  Practicing safe sex.  Taking low-dose aspirin every day.  Taking vitamin and mineral supplements as recommended by your health care provider. What happens during an annual well check? The services and screenings done by your health care provider during your annual well check will depend on your age, overall health, lifestyle risk factors, and family history of disease. Counseling  Your health care provider may ask you questions about your:  Alcohol use.  Tobacco use.  Drug use.  Emotional well-being.  Home and relationship  well-being.  Sexual activity.  Eating habits.  History of falls.  Memory and ability to understand (cognition).  Work and work Astronomerenvironment.  Reproductive health. Screening  You may have the following tests or measurements:  Height, weight, and BMI.  Blood pressure.  Lipid and cholesterol levels. These may be checked every 5 years, or more frequently if you are over 78 years old.  Skin check.  Lung cancer screening. You may have this screening every year starting at age 655 if you have a 30-pack-year history of smoking and currently smoke or have quit within the past 15 years.  Fecal occult blood test (FOBT) of the stool. You may have this test every year starting at age 78.  Flexible sigmoidoscopy or colonoscopy. You may have a sigmoidoscopy every 5 years or a colonoscopy every 10 years starting at age 78.  Hepatitis C blood test.  Hepatitis B blood test.  Sexually transmitted disease (STD) testing.  Diabetes screening. This is done by checking your blood sugar (glucose) after you have not eaten for a while (fasting). You may have this done every 1-3 years.  Bone density scan. This is done to screen for osteoporosis. You may have this done starting at age 78.  Mammogram. This may be done every 1-2 years. Talk to your health care provider about how often you should have regular mammograms. Talk with your health care provider about your test results, treatment options, and if necessary, the need for more tests. Vaccines  Your health care provider may recommend certain vaccines, such as:  Influenza vaccine. This is recommended every year.  Tetanus, diphtheria, and acellular pertussis (Tdap, Td) vaccine. You may need a Td booster  every 10 years.  Zoster vaccine. You may need this after age 22.  Pneumococcal 13-valent conjugate (PCV13) vaccine. One dose is recommended after age 14.  Pneumococcal polysaccharide (PPSV23) vaccine. One dose is recommended after age  75. Talk to your health care provider about which screenings and vaccines you need and how often you need them. This information is not intended to replace advice given to you by your health care provider. Make sure you discuss any questions you have with your health care provider. Document Released: 03/14/2015 Document Revised: 11/05/2015 Document Reviewed: 12/17/2014 Elsevier Interactive Patient Education  2017 [place] Prevention in the Home Falls can cause injuries. They can happen to people of all ages. There are many things you can do to make your home safe and to help prevent falls. What can I do on the outside of my home?  Regularly fix the edges of walkways and driveways and fix any cracks.  Remove anything that might make you trip as you walk through a door, such as a raised step or threshold.  Trim any bushes or trees on the path to your home.  Use bright outdoor lighting.  Clear any walking paths of anything that might make someone trip, such as rocks or tools.  Regularly check to see if handrails are loose or broken. Make sure that both sides of any steps have handrails.  Any raised decks and porches should have guardrails on the edges.  Have any leaves, snow, or ice cleared regularly.  Use sand or salt on walking paths during winter.  Clean up any spills in your garage right away. This includes oil or grease spills. What can I do in the bathroom?  Use night lights.  Install grab bars by the toilet and in the tub and shower. Do not use towel bars as grab bars.  Use non-skid mats or decals in the tub or shower.  If you need to sit down in the shower, use a plastic, non-slip stool.  Keep the floor dry. Clean up any water that spills on the floor as soon as it happens.  Remove soap buildup in the tub or shower regularly.  Attach bath mats securely with double-sided non-slip rug tape.  Do not have throw rugs and other things on the floor that can make  you trip. What can I do in the bedroom?  Use night lights.  Make sure that you have a light by your bed that is easy to reach.  Do not use any sheets or blankets that are too big for your bed. They should not hang down onto the floor.  Have a firm chair that has side arms. You can use this for support while you get dressed.  Do not have throw rugs and other things on the floor that can make you trip. What can I do in the kitchen?  Clean up any spills right away.  Avoid walking on wet floors.  Keep items that you use a lot in easy-to-reach places.  If you need to reach something above you, use a strong step stool that has a grab bar.  Keep electrical cords out of the way.  Do not use floor polish or wax that makes floors slippery. If you must use wax, use non-skid floor wax.  Do not have throw rugs and other things on the floor that can make you trip. What can I do with my stairs?  Do not leave any items on the stairs.  Make  sure that there are handrails on both sides of the stairs and use them. Fix handrails that are broken or loose. Make sure that handrails are as long as the stairways.  Check any carpeting to make sure that it is firmly attached to the stairs. Fix any carpet that is loose or worn.  Avoid having throw rugs at the top or bottom of the stairs. If you do have throw rugs, attach them to the floor with carpet tape.  Make sure that you have a light switch at the top of the stairs and the bottom of the stairs. If you do not have them, ask someone to add them for you. What else can I do to help prevent falls?  Wear shoes that:  Do not have high heels.  Have rubber bottoms.  Are comfortable and fit you well.  Are closed at the toe. Do not wear sandals.  If you use a stepladder:  Make sure that it is fully opened. Do not climb a closed stepladder.  Make sure that both sides of the stepladder are locked into place.  Ask someone to hold it for you, if  possible.  Clearly mark and make sure that you can see:  Any grab bars or handrails.  First and last steps.  Where the edge of each step is.  Use tools that help you move around (mobility aids) if they are needed. These include:  Canes.  Walkers.  Scooters.  Crutches.  Turn on the lights when you go into a dark area. Replace any light bulbs as soon as they burn out.  Set up your furniture so you have a clear path. Avoid moving your furniture around.  If any of your floors are uneven, fix them.  If there are any pets around you, be aware of where they are.  Review your medicines with your doctor. Some medicines can make you feel dizzy. This can increase your chance of falling. Ask your doctor what other things that you can do to help prevent falls. This information is not intended to replace advice given to you by your health care provider. Make sure you discuss any questions you have with your health care provider. Document Released: 12/12/2008 Document Revised: 07/24/2015 Document Reviewed: 03/22/2014 Elsevier Interactive Patient Education  2017 [employer].

## 2016-07-16 NOTE — Progress Notes (Signed)
Subjective:   Suzanne Rich is a 76 y.o. female who presents for an Initial Medicare Annual Wellness Visit at [facility].         Objective:    Today's Vitals   07/16/16 0938  BP: (!) 160/82  Pulse: (!) 57  Temp: 98.1 F (36.7 C)  TempSrc: Oral  SpO2: 97%  Weight: 183 lb (83 kg)  Height: 5\' 6"  (1.676 m)   Body mass index is 29.54 kg/m.   Current Medications (verified) Outpatient Encounter Prescriptions as of 07/16/2016  Medication Sig  . acetaminophen (TYLENOL) 650 MG CR tablet Take 650 mg by mouth every 6 (six) hours as needed for pain.   Marland Kitchen. AMBULATORY NON FORMULARY MEDICATION Medication Name: Med Pass 120 mL 2 times daily for nutritional support  . aspirin 81 MG chewable tablet Chew 81 mg by mouth daily.  . calcium-vitamin D (OSCAL WITH D) 500-200 MG-UNIT per tablet Take 1 tablet by mouth 3 (three) times daily.  . Cholecalciferol (VITAMIN D3) 1000 UNITS CAPS Take 2,000 capsules by mouth daily. Take 1 capsule daily  . Fluticasone-Salmeterol (ADVAIR DISKUS) 100-50 MCG/DOSE AEPB Inhale 1 puff into the lungs 2 (two) times daily.  . hydrochlorothiazide (HYDRODIURIL) 12.5 MG tablet Take 12.5 mg by mouth daily.  Marland Kitchen. levothyroxine (SYNTHROID, LEVOTHROID) 75 MCG tablet Take 75 mcg by mouth daily before breakfast. For Hypothyroidism  . metoprolol succinate (TOPROL-XL) 100 MG 24 hr tablet Take 100 mg by mouth daily. Take with or immediately following a meal.  . polyethylene glycol (MIRALAX) packet Take 17 g by mouth daily.  . simvastatin (ZOCOR) 10 MG tablet Take 10 mg by mouth daily. For Hyperlipidemia   No facility-administered encounter medications on file as of 07/16/2016.     Allergies (verified) Patient has no known allergies.   History: Past Medical History:  Diagnosis Date  . CHF (congestive heart failure) (HCC)   . COPD (chronic obstructive pulmonary disease) (HCC)   . Edema   . Gout   . Hyperlipidemia   . Hypertension   . Hypothyroidism   . OP  (osteoporosis)   . Stroke [facility](HCC)    Past Surgical History:  Procedure Laterality Date  . ABDOMINAL HYSTERECTOMY    . AMPUTATION Right 07/15/2014   Procedure: AMPUTATION ABOVE KNEE-RIGHT;  Surgeon: Sherren Kernsharles E Fields, MD;  [facility];  Service: Vascular;  Laterality: Right;   Family History  Problem Relation Age of Onset  . Heart failure Daughter   . Colon cancer Mother   . Ovarian cancer Mother   . Emphysema Sister    Social History   Occupational History  . Not on file.   Social History Main Topics  . Smoking status: Current Every Day Smoker    Packs/day: 0.10    Types: Cigarettes  . Smokeless tobacco: Never Used  . Alcohol use No  . Drug use: No  . Sexual activity: Not on file    Tobacco Counseling Ready to quit: Not Answered Counseling given: Not Answered   Activities of Daily Living In your present state of health, do you have any difficulty performing the following activities: 07/16/2016  Hearing? N  Vision? N  Difficulty concentrating or making decisions? N  Walking or climbing stairs? Y  Dressing or bathing? Y  Doing errands, shopping? Y  Preparing Food and eating ? Y  Using the Toilet? Y  In the past six months, have you accidently leaked urine? Y  Do you have problems with loss of bowel  control? Y  Managing your Medications? Y  Managing your Finances? Y  Housekeeping or managing your Housekeeping? Y  Some recent data might be hidden    Immunizations and Health Maintenance Immunization History  Administered Date(s) Administered  . Influenza-Unspecified 11/26/2012, 11/26/2013, 12/12/2014  . PPD Test 11/29/2012, 07/17/2014  . Pneumococcal-Unspecified 11/16/2013   Health Maintenance Due  Topic Date Due  . PNA vac Low Risk Adult (2 of 2 - PCV13) 11/17/2014    Patient Care Team: Oneal Grout, MD as PCP - General (Internal Medicine) Oneal Grout, MD as Consulting Physician (Internal Medicine)  Indicate any recent Medical Services you may  have received from other than [facility] providers in the past year (date may be approximate).     Assessment:   This is a routine wellness examination for Suzanne Rich.   Hearing/Vision screen No exam data present  Dietary issues and exercise activities discussed: Current Exercise Habits: The patient does not participate in regular exercise at present, Exercise limited by: orthopedic condition(s)  Goals    . Maintain Lifestyle          Starting today pt will maintain lifestyle.       Depression Screen PHQ 2/9 Scores 07/16/2016 07/09/2015  PHQ - 2 Score 0 0    Fall Risk Fall Risk  07/16/2016 07/09/2015  Falls in the past year? No No    Cognitive Function:     6CIT Screen 07/16/2016  What Year? 0 points  What month? 0 points  What time? 0 points  Count back from 20 0 points  Months in reverse 4 points  Repeat phrase 4 points  Total Score 8    Screening Tests Health Maintenance  Topic Date Due  . PNA vac Low Risk Adult (2 of 2 - PCV13) 11/17/2014  . INFLUENZA VACCINE  03/01/2017 (Originally 09/29/2016)  . TETANUS/TDAP  03/01/2018 (Originally 03/22/1959)  . DEXA SCAN  Completed      Plan:    I have personally reviewed and addressed the Medicare Annual Wellness questionnaire and have noted the following in the patient's chart:  A. Medical and social history B. Use of alcohol, tobacco or illicit drugs  C. Current medications and supplements D. Functional ability and status E.  Nutritional status F.  Physical activity G. Advance directives H. List of other physicians I.  Hospitalizations, surgeries, and ER visits in previous 12 months J.  Vitals K. Screenings to include hearing, vision, cognitive, depression L. Referrals and appointments - none  In addition, I have reviewed and discussed with patient certain preventive protocols, quality metrics, and best practice recommendations. A written personalized care plan for preventive services as well as general preventive health  recommendations were provided to patient.  See attached scanned questionnaire for additional information.   Signed,   Annetta Maw, RN Nurse Health Advisor     I agree with above and was available to answer medical question/ concerns from Nurse Health Advisor.

## 2016-07-19 IMAGING — CR DG CHEST 1V PORT
1 series · 1 of 1 positions shown · non-contrast
Comparison: None.

CLINICAL DATA: Hyponatremia. History of hypertension, stroke, COPD,
CHF. Current smoker.

EXAM:
PORTABLE CHEST - 1 VIEW

[AP]
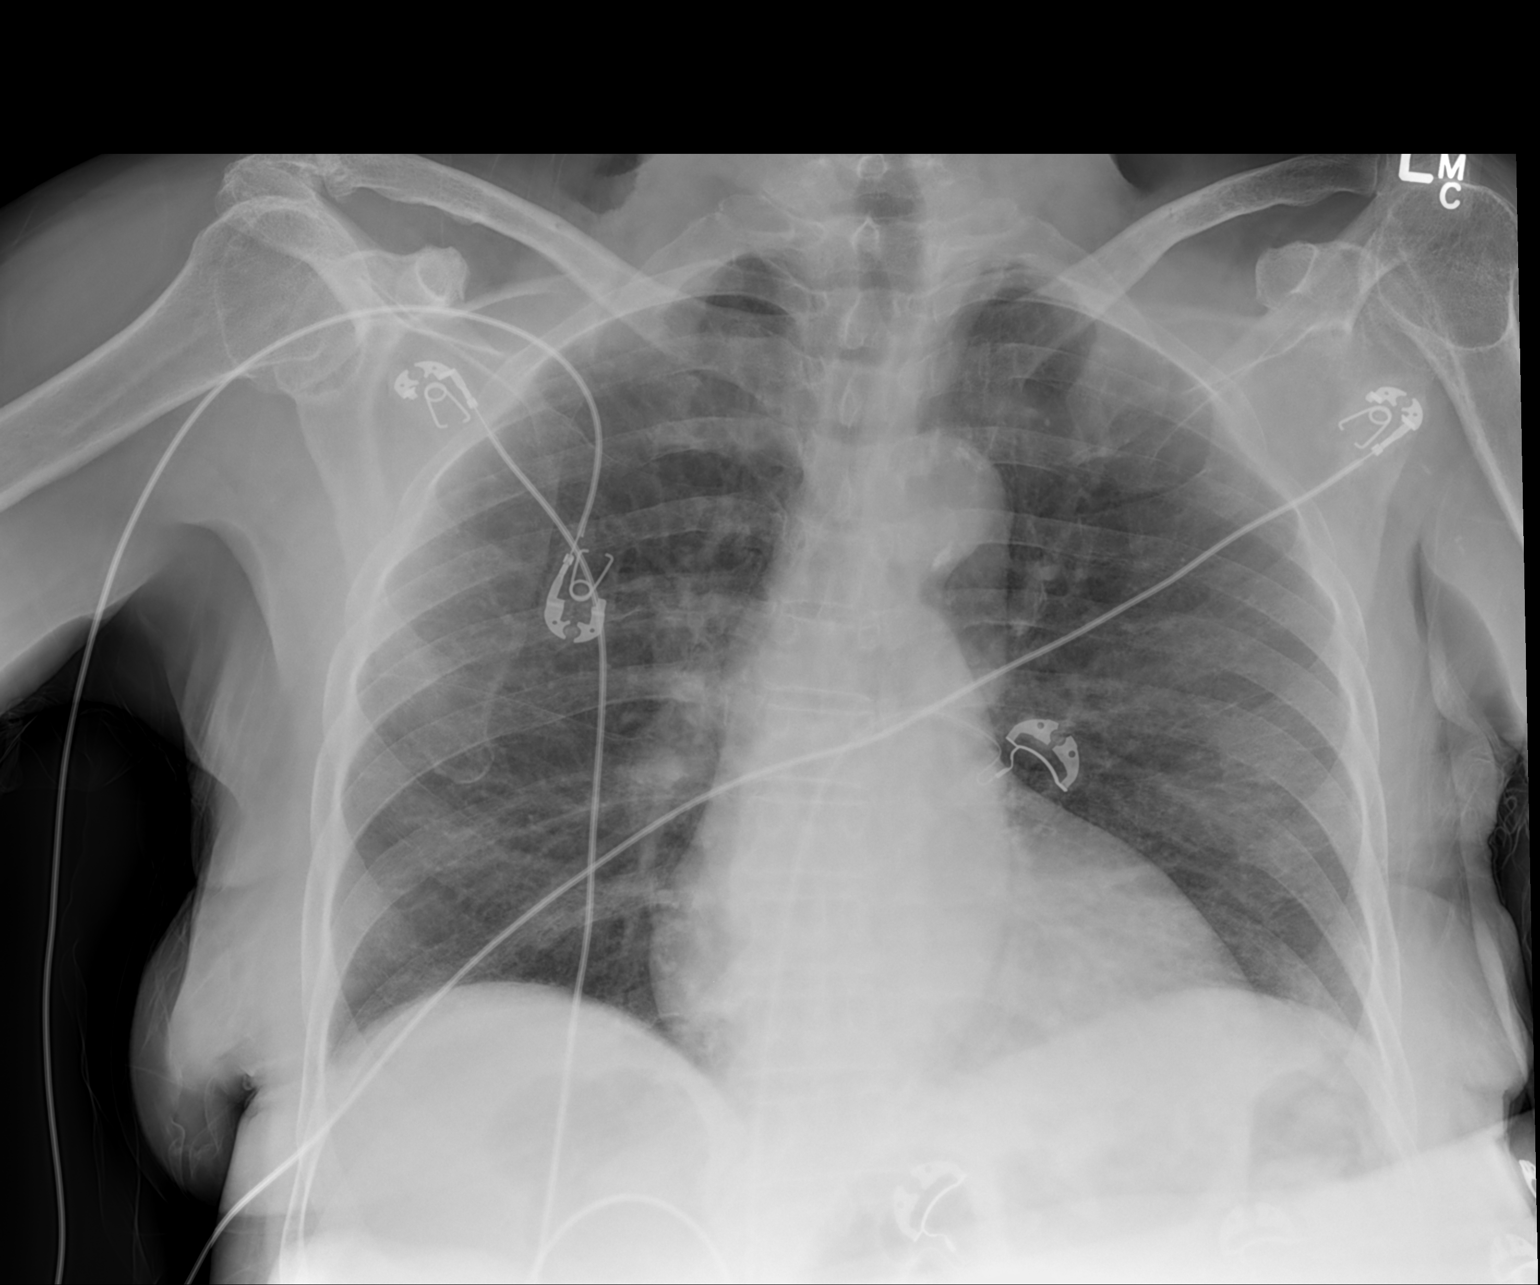

[1 of 1 positions shown; findings below may reference images not displayed]

FINDINGS: Normal heart size and pulmonary vascularity. No focal airspace
disease or consolidation in the lungs. No blunting of costophrenic
angles. No pneumothorax. Mediastinal contours appear intact.
Calcified and tortuous aorta. Degenerative changes in the spine and
shoulders. Mild thoracic scoliosis convex towards the right.
IMPRESSION: No active disease.

## 2017-10-12 ENCOUNTER — Encounter: Payer: Self-pay | Admitting: Internal Medicine

## 2017-12-21 ENCOUNTER — Other Ambulatory Visit: Payer: Self-pay | Admitting: Internal Medicine

## 2017-12-27 ENCOUNTER — Other Ambulatory Visit: Payer: Self-pay | Admitting: Nurse Practitioner

## 2017-12-27 ENCOUNTER — Other Ambulatory Visit: Payer: Self-pay | Admitting: Internal Medicine

## 2017-12-27 DIAGNOSIS — R053 Chronic cough: Secondary | ICD-10-CM

## 2017-12-27 DIAGNOSIS — R05 Cough: Secondary | ICD-10-CM

## 2018-01-02 ENCOUNTER — Ambulatory Visit
Admission: RE | Admit: 2018-01-02 | Discharge: 2018-01-02 | Disposition: A | Discharge status: Home / Self Care | Payer: Medicare Other | Source: Ambulatory Visit | Attending: Internal Medicine | Admitting: Internal Medicine

## 2018-01-02 DIAGNOSIS — R05 Cough: Secondary | ICD-10-CM

## 2018-01-02 DIAGNOSIS — R053 Chronic cough: Secondary | ICD-10-CM

## 2018-01-02 MED ORDER — IOPAMIDOL (ISOVUE-300) INJECTION 61%
75.0000 mL | Freq: Once | INTRAVENOUS | Status: AC | PRN
  Administered 2018-01-02: 75 mL via INTRAVENOUS

## 2018-03-20 ENCOUNTER — Other Ambulatory Visit: Payer: Self-pay | Admitting: Family Medicine

## 2018-03-20 DIAGNOSIS — R911 Solitary pulmonary nodule: Secondary | ICD-10-CM

## 2018-03-24 ENCOUNTER — Ambulatory Visit
Admission: RE | Admit: 2018-03-24 | Discharge: 2018-03-24 | Disposition: A | Discharge status: Home / Self Care | Payer: Medicare Other | Source: Ambulatory Visit | Attending: Family Medicine | Admitting: Family Medicine

## 2018-03-24 DIAGNOSIS — R911 Solitary pulmonary nodule: Secondary | ICD-10-CM

## 2018-03-24 MED ORDER — IOPAMIDOL (ISOVUE-300) INJECTION 61%
75.0000 mL | Freq: Once | INTRAVENOUS | Status: AC | PRN
  Administered 2018-03-24: 75 mL via INTRAVENOUS

## 2018-07-27 ENCOUNTER — Other Ambulatory Visit: Payer: Self-pay | Admitting: Family Medicine

## 2018-07-27 ENCOUNTER — Other Ambulatory Visit: Payer: Self-pay | Admitting: Anesthesiology

## 2018-07-27 DIAGNOSIS — R1900 Intra-abdominal and pelvic swelling, mass and lump, unspecified site: Secondary | ICD-10-CM

## 2018-08-11 ENCOUNTER — Ambulatory Visit
Admission: RE | Admit: 2018-08-11 | Discharge: 2018-08-11 | Disposition: A | Discharge status: Home / Self Care | Payer: Medicare Other | Source: Ambulatory Visit | Attending: Family Medicine | Admitting: Family Medicine

## 2018-08-11 DIAGNOSIS — R1900 Intra-abdominal and pelvic swelling, mass and lump, unspecified site: Secondary | ICD-10-CM

## 2018-08-11 MED ORDER — IOPAMIDOL (ISOVUE-300) INJECTION 61%
100.0000 mL | Freq: Once | INTRAVENOUS | Status: AC | PRN
  Administered 2018-08-11: 100 mL via INTRAVENOUS

## 2021-12-01 ENCOUNTER — Emergency Department (HOSPITAL_COMMUNITY): Payer: Medicare Other

## 2021-12-01 ENCOUNTER — Encounter (HOSPITAL_COMMUNITY): Payer: Self-pay

## 2021-12-01 ENCOUNTER — Inpatient Hospital Stay (HOSPITAL_COMMUNITY)
Admission: EM | Admit: 2021-12-01 | Discharge: 2021-12-30 | DRG: 872 | Disposition: E | Payer: Medicare Other | Source: Skilled Nursing Facility | Attending: Internal Medicine | Admitting: Internal Medicine

## 2021-12-01 ENCOUNTER — Inpatient Hospital Stay (HOSPITAL_COMMUNITY): Payer: Medicare Other

## 2021-12-01 DIAGNOSIS — N3001 Acute cystitis with hematuria: Secondary | ICD-10-CM

## 2021-12-01 DIAGNOSIS — N390 Urinary tract infection, site not specified: Secondary | ICD-10-CM | POA: Diagnosis present

## 2021-12-01 DIAGNOSIS — Z89611 Acquired absence of right leg above knee: Secondary | ICD-10-CM | POA: Diagnosis not present

## 2021-12-01 DIAGNOSIS — J449 Chronic obstructive pulmonary disease, unspecified: Secondary | ICD-10-CM

## 2021-12-01 DIAGNOSIS — N179 Acute kidney failure, unspecified: Secondary | ICD-10-CM | POA: Diagnosis present

## 2021-12-01 DIAGNOSIS — Z8249 Family history of ischemic heart disease and other diseases of the circulatory system: Secondary | ICD-10-CM | POA: Diagnosis not present

## 2021-12-01 DIAGNOSIS — I739 Peripheral vascular disease, unspecified: Secondary | ICD-10-CM | POA: Diagnosis present

## 2021-12-01 DIAGNOSIS — I509 Heart failure, unspecified: Secondary | ICD-10-CM | POA: Diagnosis present

## 2021-12-01 DIAGNOSIS — E860 Dehydration: Secondary | ICD-10-CM | POA: Diagnosis present

## 2021-12-01 DIAGNOSIS — A419 Sepsis, unspecified organism: Principal | ICD-10-CM

## 2021-12-01 DIAGNOSIS — I10 Essential (primary) hypertension: Secondary | ICD-10-CM | POA: Diagnosis present

## 2021-12-01 DIAGNOSIS — K219 Gastro-esophageal reflux disease without esophagitis: Secondary | ICD-10-CM | POA: Diagnosis present

## 2021-12-01 DIAGNOSIS — Z800 Family history of malignant neoplasm of digestive organs: Secondary | ICD-10-CM

## 2021-12-01 DIAGNOSIS — K56609 Unspecified intestinal obstruction, unspecified as to partial versus complete obstruction: Principal | ICD-10-CM

## 2021-12-01 DIAGNOSIS — I2510 Atherosclerotic heart disease of native coronary artery without angina pectoris: Secondary | ICD-10-CM | POA: Diagnosis present

## 2021-12-01 DIAGNOSIS — Z7951 Long term (current) use of inhaled steroids: Secondary | ICD-10-CM

## 2021-12-01 DIAGNOSIS — R1113 Vomiting of fecal matter: Secondary | ICD-10-CM

## 2021-12-01 DIAGNOSIS — E782 Mixed hyperlipidemia: Secondary | ICD-10-CM | POA: Diagnosis present

## 2021-12-01 DIAGNOSIS — F17210 Nicotine dependence, cigarettes, uncomplicated: Secondary | ICD-10-CM | POA: Diagnosis present

## 2021-12-01 DIAGNOSIS — Z8673 Personal history of transient ischemic attack (TIA), and cerebral infarction without residual deficits: Secondary | ICD-10-CM

## 2021-12-01 DIAGNOSIS — R6520 Severe sepsis without septic shock: Secondary | ICD-10-CM | POA: Diagnosis not present

## 2021-12-01 DIAGNOSIS — E876 Hypokalemia: Secondary | ICD-10-CM | POA: Diagnosis present

## 2021-12-01 DIAGNOSIS — R112 Nausea with vomiting, unspecified: Secondary | ICD-10-CM | POA: Diagnosis present

## 2021-12-01 DIAGNOSIS — I69354 Hemiplegia and hemiparesis following cerebral infarction affecting left non-dominant side: Secondary | ICD-10-CM | POA: Diagnosis not present

## 2021-12-01 DIAGNOSIS — E039 Hypothyroidism, unspecified: Secondary | ICD-10-CM | POA: Diagnosis present

## 2021-12-01 DIAGNOSIS — Z7982 Long term (current) use of aspirin: Secondary | ICD-10-CM

## 2021-12-01 DIAGNOSIS — Z7401 Bed confinement status: Secondary | ICD-10-CM | POA: Diagnosis not present

## 2021-12-01 DIAGNOSIS — E872 Acidosis, unspecified: Secondary | ICD-10-CM | POA: Diagnosis present

## 2021-12-01 DIAGNOSIS — Z79899 Other long term (current) drug therapy: Secondary | ICD-10-CM

## 2021-12-01 DIAGNOSIS — Z66 Do not resuscitate: Secondary | ICD-10-CM | POA: Diagnosis present

## 2021-12-01 DIAGNOSIS — F0394 Unspecified dementia, unspecified severity, with anxiety: Secondary | ICD-10-CM | POA: Diagnosis present

## 2021-12-01 DIAGNOSIS — Z825 Family history of asthma and other chronic lower respiratory diseases: Secondary | ICD-10-CM

## 2021-12-01 DIAGNOSIS — Z1152 Encounter for screening for COVID-19: Secondary | ICD-10-CM | POA: Diagnosis not present

## 2021-12-01 DIAGNOSIS — I110 Hypertensive heart disease with heart failure: Secondary | ICD-10-CM | POA: Diagnosis present

## 2021-12-01 DIAGNOSIS — M109 Gout, unspecified: Secondary | ICD-10-CM | POA: Diagnosis present

## 2021-12-01 DIAGNOSIS — K56600 Partial intestinal obstruction, unspecified as to cause: Secondary | ICD-10-CM | POA: Diagnosis present

## 2021-12-01 DIAGNOSIS — Z79890 Hormone replacement therapy (postmenopausal): Secondary | ICD-10-CM

## 2021-12-01 DIAGNOSIS — Z8041 Family history of malignant neoplasm of ovary: Secondary | ICD-10-CM

## 2021-12-01 LAB — URINALYSIS, ROUTINE W REFLEX MICROSCOPIC
Bilirubin Urine: NEGATIVE
Glucose, UA: NEGATIVE mg/dL
Ketones, ur: NEGATIVE mg/dL
Nitrite: NEGATIVE
Protein, ur: 100 mg/dL — AB
Specific Gravity, Urine: 1.03 (ref 1.005–1.030)
WBC, UA: >50 WBC/hpf — ABNORMAL HIGH (ref 0–5)
pH: 6 (ref 5.0–8.0)

## 2021-12-01 LAB — CBC WITH DIFFERENTIAL/PLATELET
Abs Immature Granulocytes: 0.2 10*3/uL — ABNORMAL HIGH (ref 0.00–0.07)
Basophils Absolute: 0.1 10*3/uL (ref 0.0–0.1)
Basophils Relative: 0 %
Eosinophils Absolute: 0 10*3/uL (ref 0.0–0.5)
Eosinophils Relative: 0 %
HCT: 46 % (ref 36.0–46.0)
Hemoglobin: 15.5 g/dL — ABNORMAL HIGH (ref 12.0–15.0)
Immature Granulocytes: 1 %
Lymphocytes Relative: 4 %
Lymphs Abs: 0.9 10*3/uL (ref 0.7–4.0)
MCH: 31.4 pg (ref 26.0–34.0)
MCHC: 33.7 g/dL (ref 30.0–36.0)
MCV: 93.3 fL (ref 80.0–100.0)
Monocytes Absolute: 2.7 10*3/uL — ABNORMAL HIGH (ref 0.1–1.0)
Monocytes Relative: 11 %
Neutro Abs: 20.5 10*3/uL — ABNORMAL HIGH (ref 1.7–7.7)
Neutrophils Relative %: 84 %
Platelets: 433 10*3/uL — ABNORMAL HIGH (ref 150–400)
RBC: 4.93 MIL/uL (ref 3.87–5.11)
RDW: 13.7 % (ref 11.5–15.5)
WBC: 24.4 10*3/uL — ABNORMAL HIGH (ref 4.0–10.5)
nRBC: 0 % (ref 0.0–0.2)

## 2021-12-01 LAB — COMPREHENSIVE METABOLIC PANEL
ALT: 34 U/L (ref 0–44)
AST: 30 U/L (ref 15–41)
Albumin: 3.8 g/dL (ref 3.5–5.0)
Alkaline Phosphatase: 74 U/L (ref 38–126)
Anion gap: 14 (ref 5–15)
BUN: 41 mg/dL — ABNORMAL HIGH (ref 8–23)
CO2: 30 mmol/L (ref 22–32)
Calcium: 10.2 mg/dL (ref 8.9–10.3)
Chloride: 93 mmol/L — ABNORMAL LOW (ref 98–111)
Creatinine, Ser: 1.04 mg/dL — ABNORMAL HIGH (ref 0.44–1.00)
GFR, Estimated: 53 mL/min — ABNORMAL LOW (ref >60)
Glucose, Bld: 159 mg/dL — ABNORMAL HIGH (ref 70–99)
Potassium: 3.4 mmol/L — ABNORMAL LOW (ref 3.5–5.1)
Sodium: 137 mmol/L (ref 135–145)
Total Bilirubin: 1.1 mg/dL (ref 0.3–1.2)
Total Protein: 8 g/dL (ref 6.5–8.1)

## 2021-12-01 LAB — PROTIME-INR
INR: 1.2 (ref 0.8–1.2)
Prothrombin Time: 15.5 seconds — ABNORMAL HIGH (ref 11.4–15.2)

## 2021-12-01 LAB — LACTIC ACID, PLASMA
Lactic Acid, Venous: 3.6 mmol/L (ref 0.5–1.9)
Lactic Acid, Venous: 5.6 mmol/L (ref 0.5–1.9)

## 2021-12-01 LAB — RESP PANEL BY RT-PCR (FLU A&B, COVID) ARPGX2
Influenza A by PCR: NEGATIVE
Influenza B by PCR: NEGATIVE
SARS Coronavirus 2 by RT PCR: NEGATIVE

## 2021-12-01 LAB — MAGNESIUM: Magnesium: 3.6 mg/dL — ABNORMAL HIGH (ref 1.7–2.4)

## 2021-12-01 LAB — LIPASE, BLOOD: Lipase: 22 U/L (ref 11–51)

## 2021-12-01 LAB — APTT: aPTT: 25 seconds (ref 24–36)

## 2021-12-01 MED ORDER — PROCHLORPERAZINE EDISYLATE 10 MG/2ML IJ SOLN
10.0000 mg | Freq: Four times a day (QID) | INTRAMUSCULAR | Status: DC | PRN
  Administered 2021-12-01: 10 mg via INTRAVENOUS
  Filled 2021-12-01: qty 2

## 2021-12-01 MED ORDER — SODIUM CHLORIDE 0.9 % IV SOLN
2.0000 g | INTRAVENOUS | Status: DC
  Administered 2021-12-01: 2 g via INTRAVENOUS
  Filled 2021-12-01: qty 20

## 2021-12-01 MED ORDER — SCOPOLAMINE 1 MG/3DAYS TD PT72
1.0000 | MEDICATED_PATCH | TRANSDERMAL | Status: DC
  Administered 2021-12-01: 1.5 mg via TRANSDERMAL
  Filled 2021-12-01: qty 1

## 2021-12-01 MED ORDER — LACTATED RINGERS IV BOLUS
500.0000 mL | Freq: Once | INTRAVENOUS | Status: AC
  Administered 2021-12-01: 500 mL via INTRAVENOUS

## 2021-12-01 MED ORDER — SODIUM CHLORIDE 0.9 % IV SOLN
12.5000 mg | Freq: Four times a day (QID) | INTRAVENOUS | Status: DC | PRN
  Administered 2021-12-01: 12.5 mg via INTRAVENOUS
  Filled 2021-12-01: qty 12.5

## 2021-12-01 MED ORDER — SODIUM CHLORIDE 0.9 % IV SOLN: INTRAVENOUS | Status: DC

## 2021-12-01 MED ORDER — HEPARIN SODIUM (PORCINE) 5000 UNIT/ML IJ SOLN
5000.0000 [IU] | Freq: Three times a day (TID) | INTRAMUSCULAR | Status: DC
  Administered 2021-12-01: 5000 [IU] via SUBCUTANEOUS
  Filled 2021-12-01 (×2): qty 1

## 2021-12-01 MED ORDER — ONDANSETRON HCL 4 MG/2ML IJ SOLN
4.0000 mg | Freq: Once | INTRAMUSCULAR | Status: AC
  Administered 2021-12-01: 4 mg via INTRAVENOUS
  Filled 2021-12-01: qty 2

## 2021-12-01 MED ORDER — LORAZEPAM 2 MG/ML IJ SOLN
0.5000 mg | Freq: Once | INTRAMUSCULAR | Status: AC
  Administered 2021-12-01: 0.5 mg via INTRAVENOUS
  Filled 2021-12-01: qty 1

## 2021-12-01 MED ORDER — SODIUM CHLORIDE (PF) 0.9 % IJ SOLN
INTRAMUSCULAR | Status: AC
  Filled 2021-12-01: qty 50

## 2021-12-01 MED ORDER — FENTANYL CITRATE PF 50 MCG/ML IJ SOSY
50.0000 ug | PREFILLED_SYRINGE | Freq: Once | INTRAMUSCULAR | Status: AC
  Administered 2021-12-01: 50 ug via INTRAVENOUS
  Filled 2021-12-01: qty 1

## 2021-12-01 MED ORDER — POTASSIUM CHLORIDE 10 MEQ/100ML IV SOLN
10.0000 meq | INTRAVENOUS | Status: AC
  Administered 2021-12-01 (×2): 10 meq via INTRAVENOUS
  Filled 2021-12-01 (×2): qty 100

## 2021-12-01 MED ORDER — IOHEXOL 350 MG/ML SOLN
80.0000 mL | Freq: Once | INTRAVENOUS | Status: AC | PRN
  Administered 2021-12-01: 80 mL via INTRAVENOUS

## 2021-12-01 MED ORDER — LACTATED RINGERS IV BOLUS (SEPSIS)
1000.0000 mL | Freq: Once | INTRAVENOUS | Status: AC
  Administered 2021-12-01: 1000 mL via INTRAVENOUS

## 2021-12-01 MED ORDER — LORAZEPAM 2 MG/ML IJ SOLN: 1.0000 mg | Freq: Four times a day (QID) | INTRAMUSCULAR | Status: DC | PRN

## 2021-12-01 MED ORDER — IOHEXOL 300 MG/ML  SOLN
100.0000 mL | Freq: Once | INTRAMUSCULAR | Status: AC | PRN
  Administered 2021-12-01: 80 mL via INTRAVENOUS

## 2021-12-01 MED ORDER — LACTATED RINGERS IV SOLN: INTRAVENOUS | Status: DC

## 2021-12-01 NOTE — ED Notes (Signed)
Respiratory Therapy called- per RT, they are on their way. Pt respirations currently 36-40 bpm, 94% on 15L nonrebreather.

## 2021-12-01 NOTE — ED Triage Notes (Signed)
Pt arrives via [facility] from [facility] with concern for SBO. Pt has been having stool colored emesis for two days. Pt alert to person, which according to EMS was reported as baseline. Pt denies any pain.   Pt has MOST form at bedside.

## 2021-12-01 NOTE — Progress Notes (Signed)
Called to bedside for respiratory distress. Patient has been nauseous and vomiting. Possible aspiration. Does not wish to be intubated. Nursing unable to get NGT in place. Continue anti-emetics. Add scopolamine. Check CXR. Add ativan. Currently on NRB. Family wishes patient to remain DNR. Palliative care consult.   Jonnie Finner, DO

## 2021-12-01 NOTE — ED Notes (Signed)
Pt transported down to morgue.

## 2021-12-01 NOTE — Progress Notes (Signed)
RT NOTE:  Pt NTS per verbal order Marylyn Ishihara, MD. RT obtained a moderate amount of tan/green and thick.

## 2021-12-01 NOTE — Progress Notes (Addendum)
Pt appears to have aspirated.  Pt NT suctioned x1 down left nare for copious amount of dark brown/liquid contents, approximately 167ml.  RN aware, present in room.  Pt remains on NRB, spo2 95%

## 2021-12-01 NOTE — ED Notes (Addendum)
Another attempt was made to place the NG tube by charge nurse. Placement was unsuccessful. Patient was medicated (see MAR). Patient had a sudden change in status before the RN could leave the room. RT called. Kyle MD made aware of patient's change in status per Respiratory. MD will come to see the patient.

## 2021-12-01 NOTE — Sepsis Progress Note (Signed)
eLink monitoring code sepsis.  

## 2021-12-01 NOTE — ED Notes (Addendum)
NG tube placement unsuccessful. 2 RN's attempted placement.

## 2021-12-01 NOTE — ED Provider Notes (Signed)
[facility]-EMERGENCY DEPT Provider Note   CSN: [id number] Arrival date & time: 12/24/2021  0741     History  Chief Complaint  Patient presents with   Emesis    Suzanne Rich is a 81 y.o. female history of CVA, CHF, COPD, hypertension, hyperlipidemia, dementia alert to self only.  Patient arrived from [facility] for evaluation of emesis and possible SBO.  History obtained from RN as patient is ANO x1.  Patient having stool colored emesis x2 days, no other complaints.  Level 5 caveat dementia.   HPI     Home Medications Prior to Admission medications   Medication Sig Start Date End Date Taking? Authorizing Provider  acetaminophen (TYLENOL) 650 MG CR tablet Take 650 mg by mouth every 6 (six) hours as needed for pain.     [provider]  AMBULATORY NON FORMULARY MEDICATION Medication Name: Med Pass 120 mL 2 times daily for nutritional support    [provider]  aspirin 81 MG chewable tablet Chew 81 mg by mouth daily.    [provider]  calcium-vitamin D (OSCAL WITH D) 500-200 MG-UNIT per tablet Take 1 tablet by mouth 3 (three) times daily.    [provider]  Cholecalciferol (VITAMIN D3) 1000 UNITS CAPS Take 2,000 capsules by mouth daily. Take 1 capsule daily    [provider]  Fluticasone-Salmeterol (ADVAIR DISKUS) 100-50 MCG/DOSE AEPB Inhale 1 puff into the lungs 2 (two) times daily.    [provider]  hydrochlorothiazide (HYDRODIURIL) 12.5 MG tablet Take 12.5 mg by mouth daily.    [provider]  levothyroxine (SYNTHROID, LEVOTHROID) 75 MCG tablet Take 75 mcg by mouth daily before breakfast. For Hypothyroidism    [provider]  metoprolol succinate (TOPROL-XL) 100 MG 24 hr tablet Take 100 mg by mouth daily. Take with or immediately following a meal.    [provider]  polyethylene glycol (MIRALAX) packet Take 17 g by mouth daily. 07/12/14   Clydia Llano, MD  simvastatin (ZOCOR)  10 MG tablet Take 10 mg by mouth daily. For Hyperlipidemia    [provider]      Allergies    Patient has no known allergies.    Review of Systems   Review of Systems  Unable to perform ROS: Dementia    Physical Exam Updated Vital Signs BP 133/87 (BP Location: Right Arm)   Pulse 93   Temp (!) 97.5 F (36.4 C) (Oral)   Resp (!) 28   SpO2 95%  Physical Exam Constitutional:      General: She is not in acute distress.    Appearance: Normal appearance. She is not ill-appearing.  HENT:     Head: Normocephalic and atraumatic.     Right Ear: External ear normal.     Left Ear: External ear normal.     Nose: Nose normal.     Mouth/Throat:     Mouth: Mucous membranes are moist.     Pharynx: Oropharynx is clear.  Eyes:     Conjunctiva/sclera: Conjunctivae normal.     Pupils: Pupils are equal, round, and reactive to light.  Cardiovascular:     Rate and Rhythm: Normal rate and regular rhythm.  Pulmonary:     Effort: Pulmonary effort is normal.     Breath sounds: Normal breath sounds.  Abdominal:     General: There is distension.     Tenderness: There is no abdominal tenderness. There is no guarding or rebound.  Musculoskeletal:  General: Normal range of motion.     Cervical back: Normal range of motion and neck supple.  Skin:    General: Skin is warm and dry.  Neurological:     Mental Status: She is alert. Mental status is at baseline.     GCS: GCS eye subscore is 4. GCS verbal subscore is 5. GCS motor subscore is 6.     Sensory: Sensation is intact.     Motor: Motor function is intact.     ED Results / Procedures / Treatments   Labs (all labs ordered are listed, but only abnormal results are displayed) Labs Reviewed  CBC WITH DIFFERENTIAL/PLATELET  COMPREHENSIVE METABOLIC PANEL  LIPASE, BLOOD  URINALYSIS, ROUTINE W REFLEX MICROSCOPIC    EKG None  Radiology No results found.  Procedures Procedures    Medications Ordered in ED Medications  - No data to display  ED Course/ Medical Decision Making/ A&P Clinical Course as of 12-24-21 1011  Tue 12/24/2021 WBC, UA(!): >50 [JL]  0942 Bacteria, UA(!): MANY [JL]  60[address] Guest): MODERATE [JL]  0942 WBC(!): 24.4 [JL]    Clinical Course User Index [JL] Regan Lemming, MD                           Medical Decision Making 81 year old female history dementia presented from nursing home today for 2 days of stool colored emesis.  On exam patient has a nontender but somewhat distended abdomen.  She is in no acute distress.  Patient at baseline mental status ANO x1.  Patient's daughter/POA Suzanne Rich at bedside.  No additional complaints patient is a reports the patient does have a large calcified mass within her abdomen, I am unable to find diagnosis or prior imaging through chart review.  Patient did arrive with a MOST form that indicated patient did not want to be hospitalized I had a long discussion with patient's daughter they report that this only meant that they did not want to be intubated or have resuscitation performed and then admitted to the hospital but they are agreeable to testing and treatment and further care today.  Plan will be basic labs and CT abdomen/pelvis to assess for SBO.  Differential includes but limited to SBO, perforation, appendicitis, cholecystitis, diverticulitis, GI bleed.  Heart rate within normal limits she is mildly tachypneic, afebrile.  Does not currently meet sepsis criteria.  Will await labs and continue to monitor  Amount and/or Complexity of Data Reviewed Labs: ordered. Decision-making details documented in ED Course. Radiology: ordered. ECG/medicine tests: ordered.  Risk Prescription drug management. Risk Details: Patient's UA appears consistent with urinary tract infection and she has leukocytosis in addition to low-grade tachycardia with a rate in the mid 90s.  Patient meets sepsis criteria, sepsis order set was initiated by Dr. Armandina Gemma.   Care handoff given to Dr. Armandina Gemma.      Note: Portions of this report may have been transcribed using voice recognition software. Every effort was made to ensure accuracy; however, inadvertent computerized transcription errors may still be present.         Final Clinical Impression(s) / ED Diagnoses Final diagnoses:  None    Rx / DC Orders ED Discharge Orders     None         Gari Crown 2021-12-24 1013    Regan Lemming, MD 2021/12/24 1724

## 2021-12-01 NOTE — Sepsis Progress Note (Signed)
Notified bedside nurse of need to draw lactic acid and blood cultures.  

## 2021-12-01 NOTE — ED Notes (Signed)
Time of Death 2304 called by Vanita Panda MD.

## 2021-12-01 NOTE — Progress Notes (Signed)
Called to provide support to family of patient. Upon arrival daughter , son and additional family members were present. Provided reflective listening and attempts at comfort as needed.

## 2021-12-01 NOTE — Progress Notes (Signed)
    OVERNIGHT PROGRESS REPORT  Notified by RN that patient has expired at 2304 Hrs  Patient was DNR. Physician verified along with RN(s)  Family was immediately available to RN.   Gershon Cull MSNA ACNPC-AG Acute Care Nurse Practitioner [place]

## 2021-12-01 NOTE — ED Notes (Addendum)
MD made aware of patient condition. Vitals, labored breathing and responsiveness given.

## 2021-12-01 NOTE — Sepsis Progress Note (Signed)
Notified bedside nurse of need to draw repeat lactic acid. 

## 2021-12-01 NOTE — Consult Note (Addendum)
Suzanne Rich Oct 28, 1940  [id number].    Requesting MD: Armandina Gemma, MD Chief Complaint/Reason for Consult: SBO  HPI:  Suzanne Rich is an 81 y/o F who presented to ED from a SNF with a cc emesis. Patient is a poor historian. Her daughter is at bedside who helped with history taking. Per report she has been having brown, feculent emesis for at least 2 days. She does not think she is passing any flatus. Last BM unknown. She is currently not complaining of any abdominal pain or nausea.   ED workup included a CT scan of the abdomen and pelvis which revealed high grade pSBO with transition zone in the left mid abdomen as well as significant stool burden in the rectum. There was also a large calcified mass noted in her left pelvis. Surgery was asked to see due to SBO.  Past abdominal surgeries: abdominal hysterectomy No blood thinning medications listed on her chart other than 81 mg ASA   Family History  Problem Relation Age of Onset   Heart failure Daughter    Colon cancer Mother    Ovarian cancer Mother    Emphysema Sister     Past Medical History:  Diagnosis Date   CHF (congestive heart failure) (HCC)    COPD (chronic obstructive pulmonary disease) ([place])    Edema    Gout    Hyperlipidemia    Hypertension    Hypothyroidism    OP (osteoporosis)    Stroke [facility])     Past Surgical History:  Procedure Laterality Date   ABDOMINAL HYSTERECTOMY     AMPUTATION Right 07/15/2014   Procedure: AMPUTATION ABOVE KNEE-RIGHT;  Surgeon: Elam Dutch, MD;  Location: [facility] OR;  Service: Vascular;  Laterality: Right;    Social History:  reports that she has been smoking cigarettes. She has been smoking an average of .1 packs per day. She has never used smokeless tobacco. She reports that she does not drink alcohol and does not use drugs.  Allergies: No Known Allergies  (Not in a hospital admission)    Physical Exam: Blood pressure 118/76, pulse 85, temperature (!) 97.5 F (36.4 C),  temperature source Oral, resp. rate (!) 29, SpO2 93 %. General: elderly female who is laying in bed in NAD HEENT: head is normocephalic, atraumatic.  Sclera are noninjected.  Pupils equal and round.  Ears and nose without any masses or lesions.  Mouth is dry Heart: RRR Lungs: coarse breath sounds bilaterally, no wheezes, slight increased work of breathing Abd: distended but soft, not really tender, hypoactive bowel sounds MS: s/p right AKA Skin: warm and dry with no masses, lesions, or rashes Psych: Alert, oriented to self  Results for orders placed or performed during the hospital encounter of December 19, 2021 (from the past 48 hour(s))  Urinalysis, Routine w reflex microscopic     Status: Abnormal   Collection Time: 2021-12-19  8:24 AM  Result Value Ref Range   Color, Urine AMBER (A) YELLOW    Comment: BIOCHEMICALS MAY BE AFFECTED BY COLOR   APPearance CLOUDY (A) CLEAR   Specific Gravity, Urine 1.030 1.005 - 1.030   pH 6.0 5.0 - 8.0   Glucose, UA NEGATIVE NEGATIVE mg/dL   Hgb urine dipstick SMALL (A) NEGATIVE   Bilirubin Urine NEGATIVE NEGATIVE   Ketones, ur NEGATIVE NEGATIVE mg/dL   Protein, ur 100 (A) NEGATIVE mg/dL   Nitrite NEGATIVE NEGATIVE   Leukocytes,Ua MODERATE (A) NEGATIVE   RBC / HPF 21-50 0 - 5 RBC/hpf  WBC, UA >50 (H) 0 - 5 WBC/hpf   Bacteria, UA MANY (A) NONE SEEN   Squamous Epithelial / LPF 0-5 0 - 5   WBC Clumps PRESENT    Mucus PRESENT    Non Squamous Epithelial 0-5 (A) NONE SEEN    Comment: Performed at [facility], [address], [place], [place] [address]  CBC with Differential/Platelet     Status: Abnormal   Collection Time: 2021/12/31  8:26 AM  Result Value Ref Range   WBC 24.4 (H) 4.0 - 10.5 K/uL   RBC 4.93 3.87 - 5.11 MIL/uL   Hemoglobin 15.5 (H) 12.0 - 15.0 g/dL   HCT 19.1 47.8 - 29.5 %   MCV 93.3 80.0 - 100.0 fL   MCH 31.4 26.0 - 34.0 pg   MCHC 33.7 30.0 - 36.0 g/dL   RDW 62.1 30.8 - 65.7 %   Platelets 433 (H) 150 - 400 K/uL   nRBC  0.0 0.0 - 0.2 %   Neutrophils Relative % 84 %   Neutro Abs 20.5 (H) 1.7 - 7.7 K/uL   Lymphocytes Relative 4 %   Lymphs Abs 0.9 0.7 - 4.0 K/uL   Monocytes Relative 11 %   Monocytes Absolute 2.7 (H) 0.1 - 1.0 K/uL   Eosinophils Relative 0 %   Eosinophils Absolute 0.0 0.0 - 0.5 K/uL   Basophils Relative 0 %   Basophils Absolute 0.1 0.0 - 0.1 K/uL   Immature Granulocytes 1 %   Abs Immature Granulocytes 0.20 (H) 0.00 - 0.07 K/uL    Comment: Performed at [facility], [address] [place], [place] [address]  Comprehensive metabolic panel     Status: Abnormal   Collection Time: Dec 31, 2021  8:26 AM  Result Value Ref Range   Sodium 137 135 - 145 mmol/L   Potassium 3.4 (L) 3.5 - 5.1 mmol/L   Chloride 93 (L) 98 - 111 mmol/L   CO2 30 22 - 32 mmol/L   Glucose, Bld 159 (H) 70 - 99 mg/dL    Comment: Glucose reference range applies only to samples taken after fasting for at least 8 hours.   BUN 41 (H) 8 - 23 mg/dL   Creatinine, Ser 2.95 (H) 0.44 - 1.00 mg/dL   Calcium 28.4 8.9 - 13.2 mg/dL   Total Protein 8.0 6.5 - 8.1 g/dL   Albumin 3.8 3.5 - 5.0 g/dL   AST 30 15 - 41 U/L   ALT 34 0 - 44 U/L   Alkaline Phosphatase 74 38 - 126 U/L   Total Bilirubin 1.1 0.3 - 1.2 mg/dL   GFR, Estimated 53 (L) >60 mL/min    Comment: (NOTE) Calculated using the CKD-EPI Creatinine Equation (2021)    Anion gap 14 5 - 15    Comment: Performed at [facility], [address] [place], [place] [address]  Lipase, blood     Status: None   Collection Time: 12-31-21  8:26 AM  Result Value Ref Range   Lipase 22 11 - 51 U/L    Comment: Performed at [facility], [address], [place], [place] [address]  Resp Panel by RT-PCR (Flu A&B, Covid) Anterior Nasal Swab     Status: None   Collection Time: December 31, 2021 10:21 AM   Specimen: Anterior Nasal Swab  Result Value Ref Range   SARS Coronavirus 2 by RT PCR NEGATIVE NEGATIVE    Comment: (NOTE) SARS-CoV-2 target  nucleic acids are NOT DETECTED.  The SARS-CoV-2  RNA is generally detectable in upper respiratory specimens during the acute phase of infection. The lowest concentration of SARS-CoV-2 viral copies this assay can detect is 138 copies/mL. A negative result does not preclude SARS-Cov-2 infection and should not be used as the sole basis for treatment or other patient management decisions. A negative result may occur with  improper specimen collection/handling, submission of specimen other than nasopharyngeal swab, presence of viral mutation(s) within the areas targeted by this assay, and inadequate number of viral copies(<138 copies/mL). A negative result must be combined with clinical observations, patient history, and epidemiological information. The expected result is Negative.  Fact Sheet for Patients:  BloggerCourse.comhttps://www.fda.gov/media/152166/download  Fact Sheet for Healthcare Providers:  SeriousBroker.ithttps://www.fda.gov/media/152162/download  This test is no t yet approved or cleared by the [place] FDA and  has been authorized for detection and/or diagnosis of SARS-CoV-2 by FDA under an Emergency Use Authorization (EUA). This EUA will remain  in effect (meaning this test can be used) for the duration of the COVID-19 declaration under Section 564(b)(1) of the Act, 21 U.S.C.section 360bbb-3(b)(1), unless the authorization is terminated  or revoked sooner.       Influenza A by PCR NEGATIVE NEGATIVE   Influenza B by PCR NEGATIVE NEGATIVE    Comment: (NOTE) The Xpert Xpress SARS-CoV-2/FLU/RSV plus assay is intended as an aid in the diagnosis of influenza from Nasopharyngeal swab specimens and should not be used as a sole basis for treatment. Nasal washings and aspirates are unacceptable for Xpert Xpress SARS-CoV-2/FLU/RSV testing.  Fact Sheet for Patients: BloggerCourse.comhttps://www.fda.gov/media/152166/download  Fact Sheet for Healthcare Providers: SeriousBroker.ithttps://www.fda.gov/media/152162/download  This test  is not yet approved or cleared by the [place] FDA and has been authorized for detection and/or diagnosis of SARS-CoV-2 by FDA under an Emergency Use Authorization (EUA). This EUA will remain in effect (meaning this test can be used) for the duration of the COVID-19 declaration under Section 564(b)(1) of the Act, 21 U.S.C. section 360bbb-3(b)(1), unless the authorization is terminated or revoked.  Performed at [facility], [address] [place], [place] [address]   Lactic acid, plasma     Status: Abnormal   Collection Time: 12/05/2021 10:32 AM  Result Value Ref Range   Lactic Acid, Venous 3.6 (HH) 0.5 - 1.9 mmol/L    Comment: CRITICAL RESULT CALLED TO, READ BACK BY AND VERIFIED WITH LAND,S @1151  ON 12/29/2021 BY XIONG,K Performed at [facility], [address] [place], [place] [address]   Protime-INR     Status: Abnormal   Collection Time: 12/12/2021 10:37 AM  Result Value Ref Range   Prothrombin Time 15.5 (H) 11.4 - 15.2 seconds   INR 1.2 0.8 - 1.2    Comment: (NOTE) INR goal varies based on device and disease states. Performed at [facility], [address] [place], [place] [address]   APTT     Status: None   Collection Time: 12/03/2021 10:37 AM  Result Value Ref Range   aPTT 25 24 - 36 seconds    Comment: Performed at [facility], [address] [place], [place] [address]   CT ABDOMEN PELVIS W CONTRAST  Result Date: 12/19/2021 CLINICAL DATA:  Abdominal distention, vomiting x2 days EXAM: CT ABDOMEN AND PELVIS WITH CONTRAST TECHNIQUE: Multidetector CT imaging of the abdomen and pelvis was performed using the standard protocol following bolus administration of intravenous contrast. RADIATION DOSE REDUCTION: This exam was performed according to the departmental dose-optimization program which includes automated exposure control, adjustment  of the mA and/or kV according to patient size and/or use of  iterative reconstruction technique. CONTRAST:  54mL OMNIPAQUE IOHEXOL 300 MG/ML  SOLN COMPARISON:  08/11/2018 FINDINGS: Lower chest: Heart is enlarged in size. Coronary artery calcifications are seen. Left hemidiaphragm is elevated. Small right pleural effusion is seen. Small linear densities in the lower lung fields suggest possible subsegmental atelectasis. Hepatobiliary: There is fatty infiltration in liver. No focal abnormalities are seen. There is no dilation of bile ducts. Gallbladder is not seen. Pancreas: No focal abnormalities are seen. Spleen: Spleen appears smaller than usual in size. Adrenals/Urinary Tract: Adrenals are unremarkable. Right kidney is much smaller than usual in size. There is no demonstrable cortical enhancement in the right kidney. There is no hydronephrosis. There is possible 8 mm cyst in the lateral margin of left kidney. There is 13 mm cyst in the upper pole of left kidney. There are no demonstrable renal or ureteral stones. Urinary bladder is not distended. In image 84 of series 2, there is 5 mm calcific density in right posterior aspect of the urinary bladder. Stomach/Bowel: There is fluid in the lumen of lower thoracic esophagus. There is distention of stomach with fluid in the lumen. There is abnormal dilation of proximal small bowel loops with air-fluid levels. Distal small bowel loops are decompressed. Zone of transition appears to be in left mid abdomen. Appendix is not seen. There is no significant wall thickening in colon. Colon is not distended. Large amount of stool is seen in rectum. Transverse diameter of rectum measures 10.3 cm. Vascular/Lymphatic: Atherosclerotic plaques and calcifications are seen in aorta and its major branches. There is possible occlusion of right renal artery. There is possible occlusion of right internal iliac artery. Reproductive: There is a large mass lesion measuring 21.8 x 11.5 x 17.2 cm in the lower abdomen extending into the left side of  pelvis. Numerous coarse calcifications and fluid attenuation foci are seen within this lesion. There is interval increase in size of this lesion. This structure could not be conclusively connected to the vaginal cuff. There are pockets of air in the vaginal canal. Other: There is no ascites or pneumoperitoneum. Small umbilical hernia containing fat is seen. Musculoskeletal: Degenerative changes are noted in lumbar spine with encroachment of neural foramina at multiple levels. IMPRESSION: High-grade partial small bowel obstruction. Zone of transition is noted in left mid abdomen. This may be caused by internal hernia or adhesions. There is fluid in the lumen of thoracic esophagus suggesting gastroesophageal reflux. There is no pneumoperitoneum. There is no hydronephrosis. There is marked atrophy in right kidney with absence of demonstrable cortical enhancement suggesting possible right renal artery occlusion. Coronary artery disease. Small right pleural effusion. There is 5 mm calcific density in right side of urinary bladder, possibly bladder calculus. There is a large partly calcified mass measuring 21.8 cm in maximum diameter in lower abdomen and left side of pelvis. This may suggest large pedunculated fibroid or some other chronic inflammatory or neoplastic process. Large amount of stool is seen in rectum suggesting fecal impaction. Lumbar spondylosis. Other findings as described in the body of the report. Electronically Signed   By: Ernie Avena M.D.   On: 10-Dec-2021 10:42   DG Chest Portable 1 View  Result Date: 2021-12-10 CLINICAL DATA:  Vomiting. EXAM: PORTABLE CHEST 1 VIEW COMPARISON:  07/09/2014 FINDINGS: 0838 hours. Low volume film. Asymmetric elevation right hemidiaphragm. Basilar atelectasis without edema or focal airspace consolidation. Possible layering tiny right pleural effusion. Bones are diffusely  demineralized. Telemetry leads overlie the chest. IMPRESSION: Low volume film with basilar  atelectasis and possible layering tiny right pleural effusion. Electronically Signed   By: Kennith Center M.D.   On: 25-Dec-2021 08:47      Assessment/Plan pSBO - afebrile, WBC 24.4, lactic acid at 1032 was 3.6, repeat pending - Abdomen is distended but minimally tender and there is no peritonitis on exam. Suspect her lactic acidosis is from her UTI and dehydration, but will follow closely. No indication for acute surgical intervention at this time. Agree with NG tube placement for decompression. Once output slows down will plan to start her on the SBO protocol.  ED plans to disimpact her. Consider palliative consult.  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  Franne Forts, PA-C [facility] 25-Dec-2021, 11:57 AM Please see Amion for pager number during day hours 7:00am-4:30pm or 7:00am -11:30am on weekends

## 2021-12-01 NOTE — ED Provider Notes (Addendum)
Physical Exam  BP 118/76   Pulse 85   Temp (!) 97.5 F (36.4 C) (Oral)   Resp (!) 29   SpO2 93%     Procedures  .Critical Care  Performed by: Ernie Avena, MD Authorized by: Ernie Avena, MD   Critical care provider statement:    Critical care time (minutes):  45   Critical care was necessary to treat or prevent imminent or life-threatening deterioration of the following conditions:  Sepsis   Critical care was time spent personally by me on the following activities:  Development of treatment plan with patient or surrogate, discussions with consultants, evaluation of patient's response to treatment, examination of patient, ordering and review of laboratory studies, ordering and review of radiographic studies, ordering and performing treatments and interventions, pulse oximetry, re-evaluation of patient's condition and review of old charts   Care discussed with: admitting provider     ED Course / MDM   Clinical Course as of 12/17/2021 1222  Tue Dec 01, 2021  0942 WBC, UA(!): >50 [JL]  0942 Bacteria, UA(!): MANY [JL]  9678 Glori Luis): MODERATE [JL]  0942 WBC(!): 24.4 [JL]    Clinical Course User Index [JL] Ernie Avena, MD   Medical Decision Making Amount and/or Complexity of Data Reviewed Labs: ordered. Decision-making details documented in ED Course. Radiology: ordered. ECG/medicine tests: ordered.  Risk Prescription drug management. Decision regarding hospitalization.   Assumed care from [facility] at [phone]. Pt with elevated heart rate, leukocytosis, tachypnea, multiple episodes of emesis that is feculent.  Concern for small bowel obstruction, additional concern for sepsis from a UTI.  The patient's urinalysis was concerning for urinary tract infection.  Sepsis initiated and the patient was started on IV fluids, IV Rocephin.  CT Revealed: IMPRESSION:  High-grade partial small bowel obstruction. Zone of transition is  noted in left mid abdomen. This  may be caused by internal hernia or  adhesions. There is fluid in the lumen of thoracic esophagus  suggesting gastroesophageal reflux. There is no pneumoperitoneum.    There is no hydronephrosis. There is marked atrophy in right kidney  with absence of demonstrable cortical enhancement suggesting  possible right renal artery occlusion.    Coronary artery disease. Small right pleural effusion. There is 5 mm  calcific density in right side of urinary bladder, possibly bladder  calculus.    There is a large partly calcified mass measuring 21.8 cm in maximum  diameter in lower abdomen and left side of pelvis. This may suggest  large pedunculated fibroid or some other chronic inflammatory or  neoplastic process.    Large amount of stool is seen in rectum suggesting fecal impaction.  Lumbar spondylosis.    Other findings as described in the body of the report.   General surgery paged.  Spoke with Carlena Bjornstad PA regarding the patient who will come evaluate her.   A rectal exam was performed which revealed soft stool throughout the rectal vault, no clear fecal impaction present.  An NG tube was placed for bowel decompression.  I did speak with the patient's daughter bedside who confirmed that the patient is not DNR comfort measures only.  She is DNR with no intubation, no CPR, daughter would like basic resuscitative measures such as IV antibiotics, fluids, consents to NG tube placement.  Surgery bedside evaluating the patient.  Palliative care consulted for further management recommendations. I spoke with Dr. Ronaldo Miyamoto of hospitalist medicine who accepted the patient in admission.  Finding on CT abdomen pelvis with concern  for possible right renal artery occlusion, suspect occlusion is chronic given atrophy of the right kidney also seen  Given the patient's lactic acidosis and abdominal pain, will obtain CT angiogram of the abdomen pelvis to evaluate for acute mesenteric ischemia vs bowel ischemia in  setting of an SBO.   Regan Lemming, MD 11/30/2021 1223    Regan Lemming, MD 12/13/2021 1723

## 2021-12-01 NOTE — H&P (Signed)
History and Physical    Patient: Suzanne Rich TIR:[id number] DOB: [birth year] DOA: 12/16/2021 DOS: the patient was seen and examined on 12/06/2021 PCP: Patient, No Pcp Per  Patient coming from: SNF  Chief Complaint:  Chief Complaint  Patient presents with   Emesis   HPI: Suzanne Rich is a 81 y.o. female with medical history significant of HTN, HLD, PAD, hypothyroidism, CVA w/ left hemiparesis, chronic HF (unknown type). Presenting with N/V. Symptoms started 2 days. Her vomitus was like feces. She did not have any abdominal pain. She is not sure if she was given any medications at her SNF for this problem. When her symptoms did not improve this morning, she was sent to the ED for evaluation. She denies any other aggravating or alleviating factors.   Review of Systems: As mentioned in the history of present illness. All other systems reviewed and are negative. Past Medical History:  Diagnosis Date   CHF (congestive heart failure) (HCC)    COPD (chronic obstructive pulmonary disease) (HCC)    Edema    Gout    Hyperlipidemia    Hypertension    Hypothyroidism    OP (osteoporosis)    Stroke [facility])    Past Surgical History:  Procedure Laterality Date   ABDOMINAL HYSTERECTOMY     AMPUTATION Right 07/15/2014   Procedure: AMPUTATION ABOVE KNEE-RIGHT;  Surgeon: Elam Dutch, MD;  Location: [place];  Service: Vascular;  Laterality: Right;   Social History:  reports that she has been smoking cigarettes. She has been smoking an average of .1 packs per day. She has never used smokeless tobacco. She reports that she does not drink alcohol and does not use drugs.  No Known Allergies  Family History  Problem Relation Age of Onset   Heart failure Daughter    Colon cancer Mother    Ovarian cancer Mother    Emphysema Sister     Prior to Admission medications   Medication Sig Start Date End Date Taking? Authorizing Provider  acetaminophen (TYLENOL) 650 MG CR tablet Take 650 mg by mouth  every 6 (six) hours as needed for pain.     [provider]  AMBULATORY NON FORMULARY MEDICATION Medication Name: Med Pass 120 mL 2 times daily for nutritional support    [provider]  aspirin 81 MG chewable tablet Chew 81 mg by mouth daily.    [provider]  calcium-vitamin D (OSCAL WITH D) 500-200 MG-UNIT per tablet Take 1 tablet by mouth 3 (three) times daily.    [provider]  Cholecalciferol (VITAMIN D3) 1000 UNITS CAPS Take 2,000 capsules by mouth daily. Take 1 capsule daily    [provider]  Fluticasone-Salmeterol (ADVAIR DISKUS) 100-50 MCG/DOSE AEPB Inhale 1 puff into the lungs 2 (two) times daily.    [provider]  hydrochlorothiazide (HYDRODIURIL) 12.5 MG tablet Take 12.5 mg by mouth daily.    [provider]  levothyroxine (SYNTHROID, LEVOTHROID) 75 MCG tablet Take 75 mcg by mouth daily before breakfast. For Hypothyroidism    [provider]  metoprolol succinate (TOPROL-XL) 100 MG 24 hr tablet Take 100 mg by mouth daily. Take with or immediately following a meal.    [provider]  polyethylene glycol (MIRALAX) packet Take 17 g by mouth daily. 07/12/14   Verlee Monte, MD  simvastatin (ZOCOR) 10 MG tablet Take 10 mg by mouth daily. For Hyperlipidemia    [provider]    Physical Exam: Vitals:   12/21/2021 0930 12/14/2021  1045 12/18/2021 1100 12-18-2021 1123  BP: 131/87 133/84 118/76   Pulse: 95 96 89 85  Resp: (!) 32 (!) 34 (!) 47 (!) 29  Temp:      TempSrc:      SpO2: 96% 95% 91% 93%   General: 81 y.o. female resting in bed in NAD Eyes: PERRL, normal sclera ENMT: Nares patent w/o discharge, orophaynx clear, dentition normal, ears w/o discharge/lesions/ulcers Neck: Supple, trachea midline Cardiovascular: RRR, +S1, S2, no m/g/r, equal pulses throughout Respiratory: upper airway transmission, normal WOB GI: BS hypoactive, distention, soft, NT, no masses noted, no organomegaly  noted MSK: No c/c, R AKA, LLE edema, chronic Neuro: A&O x 1, left UE contracture, left hemiparesis Psyc: Appropriate interaction and affect, calm/cooperative  Data Reviewed:  Results for orders placed or performed during the hospital encounter of 18-Dec-2021 (from the past 24 hour(s))  Urinalysis, Routine w reflex microscopic     Status: Abnormal   Collection Time: 2021/12/18  8:24 AM  Result Value Ref Range   Color, Urine AMBER (A) YELLOW   APPearance CLOUDY (A) CLEAR   Specific Gravity, Urine 1.030 1.005 - 1.030   pH 6.0 5.0 - 8.0   Glucose, UA NEGATIVE NEGATIVE mg/dL   Hgb urine dipstick SMALL (A) NEGATIVE   Bilirubin Urine NEGATIVE NEGATIVE   Ketones, ur NEGATIVE NEGATIVE mg/dL   Protein, ur 102 (A) NEGATIVE mg/dL   Nitrite NEGATIVE NEGATIVE   Leukocytes,Ua MODERATE (A) NEGATIVE   RBC / HPF 21-50 0 - 5 RBC/hpf   WBC, UA >50 (H) 0 - 5 WBC/hpf   Bacteria, UA MANY (A) NONE SEEN   Squamous Epithelial / LPF 0-5 0 - 5   WBC Clumps PRESENT    Mucus PRESENT    Non Squamous Epithelial 0-5 (A) NONE SEEN  CBC with Differential/Platelet     Status: Abnormal   Collection Time: 12-18-2021  8:26 AM  Result Value Ref Range   WBC 24.4 (H) 4.0 - 10.5 K/uL   RBC 4.93 3.87 - 5.11 MIL/uL   Hemoglobin 15.5 (H) 12.0 - 15.0 g/dL   HCT 72.5 36.6 - 44.0 %   MCV 93.3 80.0 - 100.0 fL   MCH 31.4 26.0 - 34.0 pg   MCHC 33.7 30.0 - 36.0 g/dL   RDW 34.7 42.5 - 95.6 %   Platelets 433 (H) 150 - 400 K/uL   nRBC 0.0 0.0 - 0.2 %   Neutrophils Relative % 84 %   Neutro Abs 20.5 (H) 1.7 - 7.7 K/uL   Lymphocytes Relative 4 %   Lymphs Abs 0.9 0.7 - 4.0 K/uL   Monocytes Relative 11 %   Monocytes Absolute 2.7 (H) 0.1 - 1.0 K/uL   Eosinophils Relative 0 %   Eosinophils Absolute 0.0 0.0 - 0.5 K/uL   Basophils Relative 0 %   Basophils Absolute 0.1 0.0 - 0.1 K/uL   Immature Granulocytes 1 %   Abs Immature Granulocytes 0.20 (H) 0.00 - 0.07 K/uL  Comprehensive metabolic panel     Status: Abnormal   Collection Time:  Dec 18, 2021  8:26 AM  Result Value Ref Range   Sodium 137 135 - 145 mmol/L   Potassium 3.4 (L) 3.5 - 5.1 mmol/L   Chloride 93 (L) 98 - 111 mmol/L   CO2 30 22 - 32 mmol/L   Glucose, Bld 159 (H) 70 - 99 mg/dL   BUN 41 (H) 8 - 23 mg/dL   Creatinine, Ser 3.87 (H) 0.44 - 1.00 mg/dL   Calcium 56.4 8.9 -  10.3 mg/dL   Total Protein 8.0 6.5 - 8.1 g/dL   Albumin 3.8 3.5 - 5.0 g/dL   AST 30 15 - 41 U/L   ALT 34 0 - 44 U/L   Alkaline Phosphatase 74 38 - 126 U/L   Total Bilirubin 1.1 0.3 - 1.2 mg/dL   GFR, Estimated 53 (L) >60 mL/min   Anion gap 14 5 - 15  Lipase, blood     Status: None   Collection Time: 12/08/2021  8:26 AM  Result Value Ref Range   Lipase 22 11 - 51 U/L  Resp Panel by RT-PCR (Flu A&B, Covid) Anterior Nasal Swab     Status: None   Collection Time: 12/17/2021 10:21 AM   Specimen: Anterior Nasal Swab  Result Value Ref Range   SARS Coronavirus 2 by RT PCR NEGATIVE NEGATIVE   Influenza A by PCR NEGATIVE NEGATIVE   Influenza B by PCR NEGATIVE NEGATIVE  Lactic acid, plasma     Status: Abnormal   Collection Time: 11/30/2021 10:32 AM  Result Value Ref Range   Lactic Acid, Venous 3.6 (HH) 0.5 - 1.9 mmol/L  Protime-INR     Status: Abnormal   Collection Time: 12/19/2021 10:37 AM  Result Value Ref Range   Prothrombin Time 15.5 (H) 11.4 - 15.2 seconds   INR 1.2 0.8 - 1.2  APTT     Status: None   Collection Time: 12/10/2021 10:37 AM  Result Value Ref Range   aPTT 25 24 - 36 seconds   CXR: Low volume film with basilar atelectasis and possible layering tiny right pleural effusion.  CT ab/pelvis High-grade partial small bowel obstruction. Zone of transition is noted in left mid abdomen. This may be caused by internal hernia or adhesions. There is fluid in the lumen of thoracic esophagus suggesting gastroesophageal reflux. There is no pneumoperitoneum. There is no hydronephrosis. There is marked atrophy in right kidney with absence of demonstrable cortical enhancement suggesting possible  right renal artery occlusion. Coronary artery disease. Small right pleural effusion. There is 5 mm calcific density in right side of urinary bladder, possibly bladder calculus. There is a large partly calcified mass measuring 21.8 cm in maximum diameter in lower abdomen and left side of pelvis. This may suggest large pedunculated fibroid or some other chronic inflammatory or neoplastic process. Large amount of stool is seen in rectum suggesting fecal impaction. Lumbar spondylosis. Other findings as described in the body of the report.   Assessment and Plan: SBO N/V     - admit to inpt, tele     - General surgery onboard; SBO protocol stated     - NPO for now  UTI Sepsis     - continue fluids, abx     - follow UCx, Bld Cx     - follow lactic acid  AKI     - no obstruction on imaging     - watch nephrotoxins     - fluids  Hypokalemia     - replace K+; check Mg2+  Hypothyroidism     - resume home regimen when off NPO status  HTN     - resume home regimen when off NPO status     - PRN available  HLD     - resume home regimen when off NPO status  COPD     - continue home regimen  CVA w/ left residuals PAD     - continue home regimen when off NPO status  GERD     -  PPI  Chronic congestive heart failure; type unknown     - HF by PCP history, no echo data, type unknown     - daily weight, I&O     - resume home regimen when off NPO status  Advance Care Planning:   Code Status: DNR  Consults: General Surgery  Family Communication: w/ daughter at bedside  Severity of Illness: The appropriate patient status for this patient is INPATIENT. Inpatient status is judged to be reasonable and necessary in order to provide the required intensity of service to ensure the patient's safety. The patient's presenting symptoms, physical exam findings, and initial radiographic and laboratory data in the context of their chronic comorbidities is felt to place them at high risk  for further clinical deterioration. Furthermore, it is not anticipated that the patient will be medically stable for discharge from the hospital within 2 midnights of admission.   * I certify that at the point of admission it is my clinical judgment that the patient will require inpatient hospital care spanning beyond 2 midnights from the point of admission due to high intensity of service, high risk for further deterioration and high frequency of surveillance required.*  Time spent in coordination of this H&P: 70 minutes  Author: Teddy Spike, DO 12/16/2021 12:31 PM  For on call review [email or web address].

## 2021-12-02 DIAGNOSIS — N179 Acute kidney failure, unspecified: Secondary | ICD-10-CM | POA: Diagnosis not present

## 2021-12-02 DIAGNOSIS — A419 Sepsis, unspecified organism: Secondary | ICD-10-CM | POA: Diagnosis not present

## 2021-12-02 DIAGNOSIS — R6520 Severe sepsis without septic shock: Secondary | ICD-10-CM | POA: Diagnosis not present

## 2021-12-03 LAB — URINE CULTURE: Culture: >=100000 — AB

## 2021-12-06 LAB — CULTURE, BLOOD (ROUTINE X 2)
Culture: NO GROWTH
Culture: NO GROWTH
Special Requests: ADEQUATE
Special Requests: ADEQUATE

## 2021-12-30 NOTE — Death Summary Note (Signed)
DEATH SUMMARY   Patient Details  Name: Suzanne Rich MRN: [id number] DOB: [birth year] [id number], No Pcp Per Admission/Discharge Information   Admit Date:  12-15-21  Date of Death: Date of Death: 12/15/21  Time of Death: Time of Death: 28-Jun-2302  Length of Stay: 1   Principle Cause of death: SBO  Hospital Diagnoses: Principal Problem:   Sepsis (HCC) Active Problems:   Congestive heart failure (HCC)   Hypothyroidism   COPD (chronic obstructive pulmonary disease) (HCC)   Mixed hyperlipidemia   Hypertension   PAD (peripheral artery disease) (HCC)   History of CVA (cerebrovascular accident)   SBO (small bowel obstruction) (HCC)   UTI (urinary tract infection)   Hypokalemia   AKI (acute kidney injury) (HCC)   GERD (gastroesophageal reflux disease)   Hospital Course: Suzanne Rich is a 81 y.o. female with medical history significant of HTN, HLD, PAD, hypothyroidism, CVA w/ left hemiparesis, chronic HF (unknown type). Presenting with N/V. Symptoms started 2 days. Her vomitus was like feces. She did not have any abdominal pain. She is not sure if she was given any medications at her SNF for this problem. When her symptoms did not improve this morning, she was sent to the ED for evaluation. She denies any other aggravating or alleviating factors.   She was admitted with a diagnosis of SBO and sepsis secondary to UTI. She was started on SBO protocol and rocephin. She was unable to tolerate placement with NGT at bedside by nursing. IR was consulted for placement. She became more anxious and dyspneic. She was given medication to help control nausea and anxiety. As the evening progressed she declined and began aspirating. Discuss with family at bedside about options. After careful consideration, they elected to respect her wishes of DNR. She was not intubated. She continued to decline. She then expired. Her TOD was 2304hrs.   Assessment and Plan: SBO N/V Aspiration    UTI Sepsis AKI Hypokalemia Hypothyroidism HTN HLD COPD CVA w/ left residuals PAD GERD Chronic congestive heart failure; type unknown     Procedures: None  Consultations: General Surgery, IR  The results of significant diagnostics from this hospitalization (including imaging, microbiology, ancillary and laboratory) are listed below for reference.   Significant Diagnostic Studies: DG CHEST PORT 1 VIEW  Result Date: 12/15/2021 CLINICAL DATA:  10026 Shortness of breath 10026 EXAM: PORTABLE CHEST 1 VIEW COMPARISON:  2021/12/15 FINDINGS: The cardiomediastinal silhouette is unchanged in contour. Small RIGHT pleural effusion. No pneumothorax. Subtle RIGHT apical reticular nodularity. IMPRESSION: 1. Small RIGHT pleural effusion. 2. Subtle RIGHT apical reticular nodularity, nonspecific but could reflect underlying infection versus aspiration. Electronically Signed   By: Meda Klinefelter M.D.   On: 12-15-2021 19:03   CT Angio Abd/Pel W and/or Wo Contrast  Result Date: 15-Dec-2021 CLINICAL DATA:  81 year old female with a history small bowel obstruction EXAM: CTA ABDOMEN AND PELVIS WITHOUT AND WITH CONTRAST TECHNIQUE: Multidetector CT imaging of the abdomen and pelvis was performed using the standard protocol during bolus administration of intravenous contrast. Multiplanar reconstructed images and MIPs were obtained and reviewed to evaluate the vascular anatomy. RADIATION DOSE REDUCTION: This exam was performed according to the departmental dose-optimization program which includes automated exposure control, adjustment of the mA and/or kV according to patient size and/or use of iterative reconstruction technique. CONTRAST:  71mL OMNIPAQUE IOHEXOL 350 MG/ML SOLN COMPARISON:  CT 12/15/21 FINDINGS: VASCULAR Aorta: Moderate atherosclerotic changes of the abdominal aorta. No aneurysm. No periaortic fluid or wall thickening. No dissection. No  pedunculated plaque or ulcerated plaque. Celiac:  Advanced atherosclerotic changes of the celiac artery origin with mixed calcified and soft plaque, likely contributing to high-grade stenosis proximally. Artery remains patent contributing to splenic artery, common hepatic artery, left gastric artery. SMA: Advanced atherosclerotic changes of the SMA with mixed calcified and soft plaque contributing to at least 50% stenosis beyond the origin. Ileocolic artery and the arcade arteries remain patent. Renals: - Right: Right renal artery occluded secondary to atherosclerotic plaque. This was evident on CT earlier today's date, and is new compared to 08/11/2018. - Left: Atherosclerotic changes at the origin of the left renal artery with mixed calcified and soft plaque without evidence of high-grade stenosis. IMA: IMA appears occluded at the origin Right lower extremity: Moderate to advanced atherosclerotic changes of the right common iliac artery and proximal external iliac artery with calcified plaque contributing to at least 50% stenosis of the common iliac artery and likely greater than 50% stenosis in the proximal right external iliac artery. Hypogastric artery is occluded. Distal external iliac artery is patent. Common femoral artery patent with mild plaque. Proximal profunda femoris and SFA patent. Left lower extremity: Moderate atherosclerotic changes of the left iliac system without high-grade stenosis. Common iliac artery and external iliac artery patent without high-grade stenosis. Hypogastric artery is patent. Common femoral artery patent with mild atherosclerosis. Proximal profunda femoris and SFA patent. Veins: Unremarkable appearance of the venous system. Review of the MIP images confirms the above findings. NON-VASCULAR Lower chest: New focal patchy airspace disease in the bilateral lower lungs, dependently not present on the CT earlier today's date. Trace pleural fluid on the right. Hepatobiliary: Unremarkable appearance of the liver. Gallbladder  nonvisualized potentially decompressed. Pancreas: Unremarkable Spleen: Unremarkable Adrenals/Urinary Tract: - Right adrenal gland: Unremarkable - Left adrenal gland: Unremarkable. - Right kidney: Atrophic right kidney without kidney perfusion, and without excretion of contrast from the earlier CT. No nephrolithiasis. No focal lesion. - Left Kidney: No hydronephrosis, nephrolithiasis, inflammation, or ureteral dilation. High density material in the collecting system favored to be residual excreted contrast. - Urinary Bladder: Excreted contrast within the urinary bladder. Stomach/Bowel: - Stomach: Hiatal hernia. Stomach is distended. No gastric tube is visualized. - Small bowel: Pattern of small bowel dilation is unchanged from earlier today, with multiple air-fluid levels, majority of small bowel distended except for distal small bowel which is decompressed. The area of transition which was identified on the comparison CT within the left abdomen is favored to represent either a torsed bowel loop, or potentially a bowel loop entrapped within internal hernia. The current CT protocol is not adequate for evaluating the bowel wall, however, the delayed phase imaging on CT recently performed this morning demonstrates edematous and thickened wall with decreased enhancement, potentially ischemic. - Appendix: Appendix is not visualized, however, no inflammatory changes are present adjacent to the cecum to indicate an appendicitis. - Colon: The proximal colon is decompressed. Large formed stool burden within the distal colon and rectum. Diverticular disease of the left colon. Lymphatic: No adenopathy. Mesenteric: No free fluid or air. No mesenteric adenopathy. Reproductive: Redemonstration of enlarged fibroid uterus. Flocculent material within the vagina, uncertain significance. Other: No hernia. Musculoskeletal: Osteopenia. Degenerative changes the spine. No displaced fracture. IMPRESSION: CT angiogram negative for signs of  acute arterial abnormality. Redemonstration of bowel obstruction, with edematous and potentially ischemic small bowel loop at the transition point in the left abdomen. This loop of small bowel has the appearance of either torsion or potentially an internal hernia. 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 Correlation with any history of symptoms of chronic mesenteric ischemia may be useful. Aortic Atherosclerosis (ICD10-I70.0). These above preliminary were discussed by telephone at the time of interpretation on 12/25/2021 at 3:12 pm with Ms Carlena Bjornstad, PA. 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 No evidence of high-grade stenosis on the left. Large formed stool burden within the distal colon/rectum, potentially representing constipation. Additional ancillary findings as above. Signed, Yvone Neu. Miachel Roux, RPVI [facility] Radiology Electronically Signed   By: Gilmer Mor D.O.   On: 12/14/2021 15:16   CT ABDOMEN PELVIS W CONTRAST  Result Date: 11/30/2021 CLINICAL DATA:  Abdominal distention, vomiting x2 days EXAM: CT ABDOMEN AND PELVIS WITH CONTRAST TECHNIQUE: Multidetector CT imaging of the abdomen and pelvis was performed using the standard protocol following bolus administration of intravenous contrast. RADIATION DOSE REDUCTION: This exam was performed according to the departmental dose-optimization program which includes automated exposure control, adjustment of the mA and/or kV according to patient size and/or use of iterative reconstruction  technique. CONTRAST:  59mL OMNIPAQUE IOHEXOL 300 MG/ML  SOLN COMPARISON:  08/11/2018 FINDINGS: Lower chest: Heart is enlarged in size. Coronary artery calcifications are seen. Left hemidiaphragm is elevated. Small right pleural effusion is seen. Small linear densities in the lower lung fields suggest possible subsegmental atelectasis. Hepatobiliary: There is fatty infiltration in liver. No focal abnormalities are seen. There is no dilation of bile ducts. Gallbladder is not seen. Pancreas: No focal abnormalities are seen. Spleen: Spleen appears smaller than usual in size. Adrenals/Urinary Tract: Adrenals are unremarkable. Right kidney is much smaller than usual in size. There is no demonstrable cortical enhancement in the right kidney. There is no hydronephrosis. There is possible 8 mm cyst in the lateral margin of left kidney. There is 13 mm cyst in the upper pole of left kidney. There are no demonstrable renal or ureteral stones. Urinary bladder is not distended. In image 84 of series 2, there is 5 mm calcific density in right posterior aspect of the urinary bladder. Stomach/Bowel: There is fluid in the lumen of lower thoracic esophagus. There is distention of stomach with fluid in the lumen. There is abnormal dilation of proximal small bowel loops with air-fluid levels. Distal small bowel loops are decompressed. Zone of transition appears to be in left mid abdomen. Appendix is not seen. There is no significant wall thickening in colon. Colon is not distended. Large amount of stool is seen in rectum. Transverse diameter of rectum measures 10.3 cm. Vascular/Lymphatic: Atherosclerotic plaques and calcifications are seen in aorta and its major branches. There is possible occlusion of right renal artery. There is possible occlusion of right internal iliac artery. Reproductive: There is a large mass lesion measuring 21.8 x 11.5 x 17.2 cm in the lower abdomen extending into the left side of pelvis. Numerous coarse  calcifications and fluid attenuation foci are seen within this lesion. There is interval increase in size of this lesion. This structure could not be conclusively connected to the vaginal cuff. There are pockets of air in the vaginal canal. Other: There is no ascites or pneumoperitoneum. Small umbilical hernia containing fat is  seen. Musculoskeletal: Degenerative changes are noted in lumbar spine with encroachment of neural foramina at multiple levels. IMPRESSION: High-grade partial small bowel obstruction. Zone of transition is noted in left mid abdomen. This may be caused by internal hernia or adhesions. There is fluid in the lumen of thoracic esophagus suggesting gastroesophageal reflux. There is no pneumoperitoneum. There is no hydronephrosis. There is marked atrophy in right kidney with absence of demonstrable cortical enhancement suggesting possible right renal artery occlusion. Coronary artery disease. Small right pleural effusion. There is 5 mm calcific density in right side of urinary bladder, possibly bladder calculus. There is a large partly calcified mass measuring 21.8 cm in maximum diameter in lower abdomen and left side of pelvis. This may suggest large pedunculated fibroid or some other chronic inflammatory or neoplastic process. Large amount of stool is seen in rectum suggesting fecal impaction. Lumbar spondylosis. Other findings as described in the body of the report. Electronically Signed   By: Elmer Picker M.D.   On: 12/22/2021 10:42   DG Chest Portable 1 View  Result Date: 12/27/2021 CLINICAL DATA:  Vomiting. EXAM: PORTABLE CHEST 1 VIEW COMPARISON:  07/09/2014 FINDINGS: 0838 hours. Low volume film. Asymmetric elevation right hemidiaphragm. Basilar atelectasis without edema or focal airspace consolidation. Possible layering tiny right pleural effusion. Bones are diffusely demineralized. Telemetry leads overlie the chest. IMPRESSION: Low volume film with basilar atelectasis and possible  layering tiny right pleural effusion. Electronically Signed   By: Misty Stanley M.D.   On: 12/08/2021 08:47    Microbiology: Recent Results (from the past 240 hour(s))  Urine Culture     Status: Abnormal   Collection Time: 12/11/2021  8:24 AM   Specimen: In/Out Cath Urine  Result Value Ref Range Status   Specimen Description   Final    IN/OUT CATH URINE Performed at [facility] [address] [place], [place] [address]    Special Requests   Final    NONE Performed at [facility], [place] [address] [place], [place] [address]    Culture >=100,000 COLONIES/mL PROTEUS MIRABILIS (A)  Final   Report Status 12/03/2021 FINAL  Final   Organism ID, Bacteria PROTEUS MIRABILIS (A)  Final      Susceptibility   Proteus mirabilis - MIC*    AMPICILLIN <=2 SENSITIVE Sensitive     CEFAZOLIN <=4 SENSITIVE Sensitive     CEFEPIME <=0.12 SENSITIVE Sensitive     CEFTRIAXONE <=0.25 SENSITIVE Sensitive     CIPROFLOXACIN <=0.25 SENSITIVE Sensitive     GENTAMICIN <=1 SENSITIVE Sensitive     IMIPENEM 2 SENSITIVE Sensitive     NITROFURANTOIN RESISTANT Resistant     TRIMETH/SULFA <=20 SENSITIVE Sensitive     AMPICILLIN/SULBACTAM <=2 SENSITIVE Sensitive     PIP/TAZO <=4 SENSITIVE Sensitive     * >=100,000 COLONIES/mL PROTEUS MIRABILIS  Resp Panel by RT-PCR (Flu A&B, Covid) Anterior Nasal Swab     Status: None   Collection Time: 12/18/2021 10:21 AM   Specimen: Anterior Nasal Swab  Result Value Ref Range Status   SARS Coronavirus 2 by RT PCR NEGATIVE NEGATIVE Final    Comment: (NOTE) SARS-CoV-2 target nucleic acids are NOT DETECTED.  The SARS-CoV-2 RNA is generally detectable in upper respiratory specimens during the acute phase of infection. The lowest concentration of SARS-CoV-2 viral copies this assay can detect is 138 copies/mL. A negative result does not preclude SARS-Cov-2 infection and should not be used as the sole basis for treatment or other patient  management  decisions. A negative result may occur with  improper specimen collection/handling, submission of specimen other than nasopharyngeal swab, presence of viral mutation(s) within the areas targeted by this assay, and inadequate number of viral copies(<138 copies/mL). A negative result must be combined with clinical observations, patient history, and epidemiological information. The expected result is Negative.  Fact Sheet for Patients:  BloggerCourse.com  Fact Sheet for Healthcare Providers:  SeriousBroker.it  This test is no t yet approved or cleared by the [place] FDA and  has been authorized for detection and/or diagnosis of SARS-CoV-2 by FDA under an Emergency Use Authorization (EUA). This EUA will remain  in effect (meaning this test can be used) for the duration of the COVID-19 declaration under Section 564(b)(1) of the Act, 21 U.S.C.section 360bbb-3(b)(1), unless the authorization is terminated  or revoked sooner.       Influenza A by PCR NEGATIVE NEGATIVE Final   Influenza B by PCR NEGATIVE NEGATIVE Final    Comment: (NOTE) The Xpert Xpress SARS-CoV-2/FLU/RSV plus assay is intended as an aid in the diagnosis of influenza from Nasopharyngeal swab specimens and should not be used as a sole basis for treatment. Nasal washings and aspirates are unacceptable for Xpert Xpress SARS-CoV-2/FLU/RSV testing.  Fact Sheet for Patients: BloggerCourse.com  Fact Sheet for Healthcare Providers: SeriousBroker.it  This test is not yet approved or cleared by the [place] FDA and has been authorized for detection and/or diagnosis of SARS-CoV-2 by FDA under an Emergency Use Authorization (EUA). This EUA will remain in effect (meaning this test can be used) for the duration of the COVID-19 declaration under Section 564(b)(1) of the Act, 21 U.S.C. section 360bbb-3(b)(1),  unless the authorization is terminated or revoked.  Performed at [facility], [address], [place], [place] [address]   Blood Culture (routine x 2)     Status: None (Preliminary result)   Collection Time: 12/21/2021 10:37 AM   Specimen: BLOOD RIGHT FOREARM  Result Value Ref Range Status   Specimen Description   Final    BLOOD RIGHT FOREARM Performed at [facility], [address], [place], [place] [address]    Special Requests   Final    BOTTLES DRAWN AEROBIC AND ANAEROBIC Blood Culture adequate volume Performed at [facility], [address], [place], [place] [address]    Culture   Final    NO GROWTH 2 DAYS Performed at [facility], [address] [place], [place] [address]    Report Status PENDING  Incomplete  Blood Culture (routine x 2)     Status: None (Preliminary result)   Collection Time: 12/20/2021  1:26 PM   Specimen: Right Antecubital; Blood  Result Value Ref Range Status   Specimen Description   Final    RIGHT ANTECUBITAL BLOOD Performed at [facility], [address], [place], [place] [address]    Special Requests   Final    BOTTLES DRAWN AEROBIC AND ANAEROBIC Blood Culture adequate volume Performed at [facility], [address] [place], [place] [address]    Culture   Final    NO GROWTH 2 DAYS Performed at [facility], [address], [place], [place] [address]    Report Status PENDING  Incomplete    Time spent: 20 minutes  Signed: Teddy Spike, DO 12/04/21

## 2021-12-30 DEATH — deceased
